# Patient Record
Sex: Male | Born: 1935 | Race: White | Hispanic: No | Marital: Married | Smoking: Never smoker
Health system: Southern US, Community
[De-identification: ages and names within clinical notes are randomized; demographics above are authoritative.]

## PROBLEM LIST (undated history)

## (undated) DIAGNOSIS — I4891 Unspecified atrial fibrillation: Secondary | ICD-10-CM

## (undated) DIAGNOSIS — I10 Essential (primary) hypertension: Secondary | ICD-10-CM

## (undated) DIAGNOSIS — E119 Type 2 diabetes mellitus without complications: Secondary | ICD-10-CM

## (undated) HISTORY — PX: BACK SURGERY: SHX140

---

## 2004-10-17 ENCOUNTER — Other Ambulatory Visit: Payer: Self-pay

## 2004-10-17 ENCOUNTER — Inpatient Hospital Stay: Payer: Self-pay | Admitting: Anesthesiology

## 2004-10-19 ENCOUNTER — Other Ambulatory Visit: Payer: Self-pay

## 2007-06-02 ENCOUNTER — Emergency Department: Payer: Self-pay | Admitting: Emergency Medicine

## 2010-11-28 HISTORY — PX: CARDIAC SURGERY: SHX584

## 2016-05-25 ENCOUNTER — Ambulatory Visit
Admission: EM | Admit: 2016-05-25 | Discharge: 2016-05-25 | Disposition: A | Discharge status: Home / Self Care | Payer: Medicare Other | Attending: Family Medicine | Admitting: Family Medicine

## 2016-05-25 DIAGNOSIS — M5431 Sciatica, right side: Secondary | ICD-10-CM | POA: Diagnosis not present

## 2016-05-25 HISTORY — DX: Type 2 diabetes mellitus without complications: E11.9

## 2016-05-25 HISTORY — DX: Unspecified atrial fibrillation: I48.91

## 2016-05-25 HISTORY — DX: Essential (primary) hypertension: I10

## 2016-05-25 MED ORDER — HYDROCODONE-ACETAMINOPHEN 5-325 MG PO TABS: ORAL_TABLET | ORAL | Status: AC

## 2016-05-25 MED ORDER — METHYLPREDNISOLONE SODIUM SUCC 125 MG IJ SOLR
125.0000 mg | Freq: Once | INTRAMUSCULAR | Status: AC
  Administered 2016-05-25: 125 mg via INTRAMUSCULAR

## 2016-05-25 NOTE — ED Provider Notes (Signed)
CSN: [id number]     Arrival date & time 05/25/16  1028 History   First MD Initiated Contact with Patient 05/25/16 1054     Chief Complaint  Patient presents with  . Back Pain   (Consider location/radiation/quality/duration/timing/severity/associated sxs/prior Treatment) HPI Comments: 82 yo male with a c/o 2 days h/o right low back pain radiating down the back of the leg. Denies any trauma, fevers, chills, numbness/tingling, saddle anesthesia, bowel or bladder problems, rash. States has had similar episodes in the past and states his PCP gives him a "steroid shot".   Patient is a 82 y.o. male presenting with back pain. The history is provided by the patient.  Back Pain   Past Medical History  Diagnosis Date  . Hypertension   . Diabetes (HCC)   . Atrial fibrillation [facility](HCC)    Past Surgical History  Procedure Laterality Date  . Back surgery  1981,1991    X 3  . Cardiac surgery  2012   History reviewed. No pertinent family history. Social History  Substance Use Topics  . Smoking status: Never Smoker   . Smokeless tobacco: None  . Alcohol Use: No    Review of Systems  Musculoskeletal: Positive for back pain.    Allergies  Review of patient's allergies indicates no known allergies.  Home Medications   Prior to Admission medications   Medication Sig Start Date End Date Taking? Authorizing Provider  cloNIDine (CATAPRES) 0.1 MG tablet Take 0.1 mg by mouth 2 (two) times daily.   Yes Historical Provider, MD  glipiZIDE (GLUCOTROL) 10 MG tablet Take 10 mg by mouth daily before breakfast.   Yes Historical Provider, MD  lisinopril (PRINIVIL,ZESTRIL) 20 MG tablet Take 20 mg by mouth daily.   Yes Historical Provider, MD  pioglitazone (ACTOS) 30 MG tablet Take 30 mg by mouth daily.   Yes Historical Provider, MD  warfarin (COUMADIN) 2 MG tablet Take 2 mg by mouth daily.   Yes Historical Provider, MD  HYDROcodone-acetaminophen (NORCO/VICODIN) 5-325 MG tablet 1-2 tabs po q 8 hours prn  05/25/16   Payton Mccallumrlando Nimra Puccinelli, MD   Meds Ordered and Administered this Visit   Medications  methylPREDNISolone sodium succinate (SOLU-MEDROL) 125 mg/2 mL injection 125 mg (125 mg Intramuscular Given 05/25/16 1143)    BP 132/54 mmHg  Pulse 73  Temp(Src) 97.8 F (36.6 C) (Tympanic)  Resp 16  Ht 6\' 2"  (1.88 m)  Wt 210 lb (95.255 kg)  BMI 26.95 kg/m2  SpO2 97% No data found.   Physical Exam  Constitutional: He appears well-developed and well-nourished. No distress.  Neck: Normal range of motion. Neck supple. No tracheal deviation present.  Pulmonary/Chest: Effort normal. No stridor. No respiratory distress.  Musculoskeletal:       Cervical back: He exhibits normal range of motion, no bony tenderness, no swelling, no edema, no deformity, no laceration, no pain and normal pulse.       Lumbar back: He exhibits tenderness (over the lumbar sacral paraspinous muscles on the right and over the right buttock ) and spasm. He exhibits normal range of motion, no bony tenderness, no swelling, no edema, no deformity, no laceration, no pain and normal pulse.  Neurological: He is alert. He has normal reflexes. He exhibits normal muscle tone. Coordination normal.  Skin: No rash noted. He is not diaphoretic.  Nursing note and vitals reviewed.   ED Course  Procedures (including critical care time)  Labs Review Labs Reviewed - No data to display  Imaging Review No results found.  Visual Acuity Review  Right Eye Distance:   Left Eye Distance:   Bilateral Distance:    Right Eye Near:   Left Eye Near:    Bilateral Near:         MDM   1. Sciatica of right side    Discharge Medication List as of 05/25/2016 11:36 AM    START taking these medications   Details  HYDROcodone-acetaminophen (NORCO/VICODIN) 5-325 MG tablet 1-2 tabs po q 8 hours prn, Print       1.  diagnosis reviewed with patient 2. rx as per orders above; reviewed possible side effects, interactions, risks and benefits   3. Patient given solumedrol 125mg  IM x1 4.Recommend supportive treatment with gentle back stretches 5. Follow-up prn if symptoms worsen or don't improve    Payton Mccallumrlando Aerika Groll, MD 05/25/16 2131

## 2016-05-25 NOTE — Discharge Instructions (Signed)
°Back Exercises °The following exercises strengthen the muscles that help to support the back. They also help to keep the lower back flexible. Doing these exercises can help to prevent back pain or lessen existing pain. °If you have back pain or discomfort, try doing these exercises 2-3 times each day or as told by your health care provider. When the pain goes away, do them once each day, but increase the number of times that you repeat the steps for each exercise (do more repetitions). If you do not have back pain or discomfort, do these exercises once each day or as told by your health care provider. °EXERCISES °Single Knee to Chest °Repeat these steps 3-5 times for each leg: °1. Lie on your back on a firm bed or the floor with your legs extended. °2. Bring one knee to your chest. Your other leg should stay extended and in contact with the floor. °3. Hold your knee in place by grabbing your knee or thigh. °4. Pull on your knee until you feel a gentle stretch in your lower back. °5. Hold the stretch for 10-30 seconds. °6. Slowly release and straighten your leg. °Pelvic Tilt °Repeat these steps 5-10 times: °1. Lie on your back on a firm bed or the floor with your legs extended. °2. Bend your knees so they are pointing toward the ceiling and your feet are flat on the floor. °3. Tighten your lower abdominal muscles to press your lower back against the floor. This motion will tilt your pelvis so your tailbone points up toward the ceiling instead of pointing to your feet or the floor. °4. With gentle tension and even breathing, hold this position for 5-10 seconds. °Cat-Cow °Repeat these steps until your lower back becomes more flexible: °1. Get into a hands-and-knees position on a firm surface. Keep your hands under your shoulders, and keep your knees under your hips. You may place padding under your knees for comfort. °2. Let your head hang down, and point your tailbone toward the floor so your lower back becomes  rounded like the back of a cat. °3. Hold this position for 5 seconds. °4. Slowly lift your head and point your tailbone up toward the ceiling so your back forms a sagging arch like the back of a cow. °5. Hold this position for 5 seconds. °Press-Ups °Repeat these steps 5-10 times: °1. Lie on your abdomen (face-down) on the floor. °2. Place your palms near your head, about shoulder-width apart. °3. While you keep your back as relaxed as possible and keep your hips on the floor, slowly straighten your arms to raise the top half of your body and lift your shoulders. Do not use your back muscles to raise your upper torso. You may adjust the placement of your hands to make yourself more comfortable. °4. Hold this position for 5 seconds while you keep your back relaxed. °5. Slowly return to lying flat on the floor. °Bridges °Repeat these steps 10 times: °1. Lie on your back on a firm surface. °2. Bend your knees so they are pointing toward the ceiling and your feet are flat on the floor. °3. Tighten your buttocks muscles and lift your buttocks off of the floor until your waist is at almost the same height as your knees. You should feel the muscles working in your buttocks and the back of your thighs. If you do not feel these muscles, slide your feet 1-2 inches farther away from your buttocks. °4. Hold this position for 3-5   seconds. 5. Slowly lower your hips to the starting position, and allow your buttocks muscles to relax completely. If this exercise is too easy, try doing it with your arms crossed over your chest. Abdominal Crunches Repeat these steps 5-10 times: 1. Lie on your back on a firm bed or the floor with your legs extended. 2. Bend your knees so they are pointing toward the ceiling and your feet are flat on the floor. 3. Cross your arms over your chest. 4. Tip your chin slightly toward your chest without bending your neck. 5. Tighten your abdominal muscles and slowly raise your trunk (torso) high  enough to lift your shoulder blades a tiny bit off of the floor. Avoid raising your torso higher than that, because it can put too much stress on your low back and it does not help to strengthen your abdominal muscles. 6. Slowly return to your starting position. Back Lifts Repeat these steps 5-10 times: 1. Lie on your abdomen (face-down) with your arms at your sides, and rest your forehead on the floor. 2. Tighten the muscles in your legs and your buttocks. 3. Slowly lift your chest off of the floor while you keep your hips pressed to the floor. Keep the back of your head in line with the curve in your back. Your eyes should be looking at the floor. 4. Hold this position for 3-5 seconds. 5. Slowly return to your starting position. SEEK MEDICAL CARE IF:  Your back pain or discomfort gets much worse when you do an exercise.  Your back pain or discomfort does not lessen within 2 hours after you exercise. If you have any of these problems, stop doing these exercises right away. Do not do them again unless your health care provider says that you can. SEEK IMMEDIATE MEDICAL CARE IF:  You develop sudden, severe back pain. If this happens, stop doing the exercises right away. Do not do them again unless your health care provider says that you can.   This information is not intended to replace advice given to you by your health care provider. Make sure you discuss any questions you have with your health care provider.   Document Released: 12/22/2004 Document Revised: 08/05/2015 Document Reviewed: 01/08/2015 Elsevier Interactive Patient Education 2016 Elsevier Inc. Radicular Pain Radicular pain in either the arm or leg is usually from a bulging or herniated disk in the spine. A piece of the herniated disk may press against the nerves as the nerves exit the spine. This causes pain which is felt at the tips of the nerves down the arm or leg. Other causes of radicular pain may  include: 7. Fractures. 8. Heart disease. 9. Cancer. 10. An abnormal and usually degenerative state of the nervous system or nerves (neuropathy). Diagnosis may require CT or MRI scanning to determine the primary cause.  Nerves that start at the neck (nerve roots) may cause radicular pain in the outer shoulder and arm. It can spread down to the thumb and fingers. The symptoms vary depending on which nerve root has been affected. In most cases radicular pain improves with conservative treatment. Neck problems may require physical therapy, a neck collar, or cervical traction. Treatment may take many weeks, and surgery may be considered if the symptoms do not improve.  Conservative treatment is also recommended for sciatica. Sciatica causes pain to radiate from the lower back or buttock area down the leg into the foot. Often there is a history of back problems. Most patients with sciatica  are better after 2 to 4 weeks of rest and other supportive care. Short term bed rest can reduce the disk pressure considerably. Sitting, however, is not a good position since this increases the pressure on the disk. You should avoid bending, lifting, and all other activities which make the problem worse. Traction can be used in severe cases. Surgery is usually reserved for patients who do not improve within the first months of treatment. Only take over-the-counter or prescription medicines for pain, discomfort, or fever as directed by your caregiver. Narcotics and muscle relaxants may help by relieving more severe pain and spasm and by providing mild sedation. Cold or massage can give significant relief. Spinal manipulation is not recommended. It can increase the degree of disc protrusion. Epidural steroid injections are often effective treatment for radicular pain. These injections deliver medicine to the spinal nerve in the space between the protective covering of the spinal cord and back bones (vertebrae). Your caregiver can  give you more information about steroid injections. These injections are most effective when given within two weeks of the onset of pain.  You should see your caregiver for follow up care as recommended. A program for neck and back injury rehabilitation with stretching and strengthening exercises is an important part of management.  SEEK IMMEDIATE MEDICAL CARE IF: 5. You develop increased pain, weakness, or numbness in your arm or leg. 6. You develop difficulty with bladder or bowel control. 7. You develop abdominal pain.   This information is not intended to replace advice given to you by your health care provider. Make sure you discuss any questions you have with your health care provider.   Document Released: 12/22/2004 Document Revised: 12/05/2014 Document Reviewed: 06/10/2015 Elsevier Interactive Patient Education [employer]2016 Elsevier Inc.

## 2016-05-25 NOTE — ED Notes (Addendum)
Patient complains of lower back pain with radiation into right leg. Patient states that pain started 2 days ago and has been worsening. Patient has no known injury to area. Patient denies trouble urinating. Patient is on chronic coumadin. States that he did not take any last night because he forgot. He is here from [place] visiting family and will not make it to his doctor in [place] on Friday for his PT/INR check.

## 2016-06-02 ENCOUNTER — Emergency Department: Payer: Medicare Other

## 2016-06-02 ENCOUNTER — Emergency Department
Admission: EM | Admit: 2016-06-02 | Discharge: 2016-06-03 | Disposition: A | Discharge status: Home / Self Care | Payer: Medicare Other | Attending: Emergency Medicine | Admitting: Emergency Medicine

## 2016-06-02 DIAGNOSIS — I10 Essential (primary) hypertension: Secondary | ICD-10-CM | POA: Insufficient documentation

## 2016-06-02 DIAGNOSIS — R079 Chest pain, unspecified: Secondary | ICD-10-CM | POA: Diagnosis present

## 2016-06-02 DIAGNOSIS — T148XXA Other injury of unspecified body region, initial encounter: Secondary | ICD-10-CM

## 2016-06-02 DIAGNOSIS — E119 Type 2 diabetes mellitus without complications: Secondary | ICD-10-CM | POA: Diagnosis not present

## 2016-06-02 DIAGNOSIS — M25511 Pain in right shoulder: Secondary | ICD-10-CM

## 2016-06-02 DIAGNOSIS — Y939 Activity, unspecified: Secondary | ICD-10-CM | POA: Insufficient documentation

## 2016-06-02 DIAGNOSIS — Y929 Unspecified place or not applicable: Secondary | ICD-10-CM | POA: Diagnosis not present

## 2016-06-02 DIAGNOSIS — W010XXA Fall on same level from slipping, tripping and stumbling without subsequent striking against object, initial encounter: Secondary | ICD-10-CM | POA: Insufficient documentation

## 2016-06-02 DIAGNOSIS — S40011A Contusion of right shoulder, initial encounter: Secondary | ICD-10-CM | POA: Diagnosis not present

## 2016-06-02 DIAGNOSIS — Y999 Unspecified external cause status: Secondary | ICD-10-CM | POA: Diagnosis not present

## 2016-06-02 DIAGNOSIS — R0602 Shortness of breath: Secondary | ICD-10-CM | POA: Insufficient documentation

## 2016-06-02 LAB — COMPREHENSIVE METABOLIC PANEL
ALK PHOS: 70 U/L (ref 38–126)
ALT: 24 U/L (ref 17–63)
AST: 24 U/L (ref 15–41)
Albumin: 3.5 g/dL (ref 3.5–5.0)
Anion gap: 9 (ref 5–15)
BUN: 41 mg/dL — ABNORMAL HIGH (ref 6–20)
CHLORIDE: 102 mmol/L (ref 101–111)
CO2: 24 mmol/L (ref 22–32)
CREATININE: 1.66 mg/dL — AB (ref 0.61–1.24)
Calcium: 8.7 mg/dL — ABNORMAL LOW (ref 8.9–10.3)
GFR calc Af Amer: 43 mL/min — ABNORMAL LOW (ref >60)
GFR calc non Af Amer: 37 mL/min — ABNORMAL LOW (ref >60)
Glucose, Bld: 145 mg/dL — ABNORMAL HIGH (ref 65–99)
Potassium: 3.7 mmol/L (ref 3.5–5.1)
SODIUM: 135 mmol/L (ref 135–145)
Total Bilirubin: 0.6 mg/dL (ref 0.3–1.2)
Total Protein: 6.4 g/dL — ABNORMAL LOW (ref 6.5–8.1)

## 2016-06-02 LAB — TROPONIN I: Troponin I: <0.03 ng/mL (ref <0.03)

## 2016-06-02 LAB — CBC WITH DIFFERENTIAL/PLATELET
BASOS ABS: 0.1 10*3/uL (ref 0–0.1)
BASOS PCT: 2 %
EOS ABS: 0.1 10*3/uL (ref 0–0.7)
Eosinophils Relative: 2 %
HEMATOCRIT: 35.1 % — AB (ref 40.0–52.0)
HEMOGLOBIN: 12.2 g/dL — AB (ref 13.0–18.0)
Lymphocytes Relative: 19 %
Lymphs Abs: 1 10*3/uL (ref 1.0–3.6)
MCH: 32.3 pg (ref 26.0–34.0)
MCHC: 34.7 g/dL (ref 32.0–36.0)
MCV: 93.1 fL (ref 80.0–100.0)
Monocytes Absolute: 0.5 10*3/uL (ref 0.2–1.0)
Monocytes Relative: 10 %
NEUTROS ABS: 3.5 10*3/uL (ref 1.4–6.5)
NEUTROS PCT: 67 %
Platelets: 196 10*3/uL (ref 150–440)
RBC: 3.77 MIL/uL — AB (ref 4.40–5.90)
RDW: 15.4 % — ABNORMAL HIGH (ref 11.5–14.5)
WBC: 5.2 10*3/uL (ref 3.8–10.6)

## 2016-06-02 LAB — PROTIME-INR
INR: 2.72
PROTHROMBIN TIME: 28.4 s — AB (ref 11.4–15.0)

## 2016-06-02 LAB — LACTIC ACID, PLASMA: LACTIC ACID, VENOUS: 0.7 mmol/L (ref 0.5–1.9)

## 2016-06-02 LAB — BRAIN NATRIURETIC PEPTIDE: B NATRIURETIC PEPTIDE 5: 226 pg/mL — AB (ref 0.0–100.0)

## 2016-06-02 MED ORDER — ONDANSETRON HCL 4 MG/2ML IJ SOLN
4.0000 mg | Freq: Once | INTRAMUSCULAR | Status: AC
  Administered 2016-06-02: 4 mg via INTRAVENOUS
  Filled 2016-06-02: qty 2

## 2016-06-02 MED ORDER — MORPHINE SULFATE (PF) 4 MG/ML IV SOLN
4.0000 mg | Freq: Once | INTRAVENOUS | Status: AC
  Administered 2016-06-02: 4 mg via INTRAVENOUS
  Filled 2016-06-02: qty 1

## 2016-06-02 MED ORDER — TRAMADOL HCL 50 MG PO TABS: 50.0000 mg | ORAL_TABLET | Freq: Four times a day (QID) | ORAL | Status: AC | PRN
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
## 2016-06-03 LAB — TROPONIN I

## 2016-06-03 MED ORDER — TRAMADOL HCL 50 MG PO TABS
50.0000 mg | ORAL_TABLET | Freq: Once | ORAL | Status: AC
Start: 2016-06-03 — End: 2016-06-03
  Administered 2016-06-03: 50 mg via ORAL
  Filled 2016-06-03: qty 1

## 2017-09-29 ENCOUNTER — Encounter: Payer: Self-pay | Admitting: Emergency Medicine

## 2017-09-29 ENCOUNTER — Emergency Department: Payer: Medicare Other

## 2017-09-29 ENCOUNTER — Ambulatory Visit (INDEPENDENT_AMBULATORY_CARE_PROVIDER_SITE_OTHER)
Admission: EM | Admit: 2017-09-29 | Discharge: 2017-09-29 | Disposition: A | Discharge status: Hospice | Payer: Medicare Other | Source: Home / Self Care | Attending: Family Medicine | Admitting: Family Medicine

## 2017-09-29 ENCOUNTER — Emergency Department
Admission: EM | Admit: 2017-09-29 | Discharge: 2017-09-29 | Disposition: A | Discharge status: Home / Self Care | Payer: Medicare Other | Attending: Emergency Medicine | Admitting: Emergency Medicine

## 2017-09-29 DIAGNOSIS — L03115 Cellulitis of right lower limb: Secondary | ICD-10-CM

## 2017-09-29 DIAGNOSIS — I4819 Other persistent atrial fibrillation: Secondary | ICD-10-CM

## 2017-09-29 DIAGNOSIS — L98491 Non-pressure chronic ulcer of skin of other sites limited to breakdown of skin: Secondary | ICD-10-CM | POA: Insufficient documentation

## 2017-09-29 DIAGNOSIS — L97511 Non-pressure chronic ulcer of other part of right foot limited to breakdown of skin: Secondary | ICD-10-CM

## 2017-09-29 DIAGNOSIS — I481 Persistent atrial fibrillation: Secondary | ICD-10-CM

## 2017-09-29 DIAGNOSIS — N179 Acute kidney failure, unspecified: Secondary | ICD-10-CM | POA: Insufficient documentation

## 2017-09-29 DIAGNOSIS — Z7901 Long term (current) use of anticoagulants: Secondary | ICD-10-CM

## 2017-09-29 DIAGNOSIS — I10 Essential (primary) hypertension: Secondary | ICD-10-CM

## 2017-09-29 DIAGNOSIS — E1151 Type 2 diabetes mellitus with diabetic peripheral angiopathy without gangrene: Secondary | ICD-10-CM | POA: Insufficient documentation

## 2017-09-29 DIAGNOSIS — E11621 Type 2 diabetes mellitus with foot ulcer: Secondary | ICD-10-CM | POA: Diagnosis not present

## 2017-09-29 DIAGNOSIS — E119 Type 2 diabetes mellitus without complications: Secondary | ICD-10-CM | POA: Insufficient documentation

## 2017-09-29 DIAGNOSIS — E118 Type 2 diabetes mellitus with unspecified complications: Secondary | ICD-10-CM

## 2017-09-29 DIAGNOSIS — E860 Dehydration: Secondary | ICD-10-CM | POA: Diagnosis not present

## 2017-09-29 DIAGNOSIS — Z79899 Other long term (current) drug therapy: Secondary | ICD-10-CM | POA: Insufficient documentation

## 2017-09-29 DIAGNOSIS — Z7984 Long term (current) use of oral hypoglycemic drugs: Secondary | ICD-10-CM

## 2017-09-29 DIAGNOSIS — L539 Erythematous condition, unspecified: Secondary | ICD-10-CM | POA: Diagnosis present

## 2017-09-29 LAB — CBC WITH DIFFERENTIAL/PLATELET
BASOS ABS: 0.1 10*3/uL (ref 0–0.1)
BASOS PCT: 1 %
Eosinophils Absolute: 0.1 10*3/uL (ref 0–0.7)
Eosinophils Relative: 1 %
HEMATOCRIT: 42.2 % (ref 40.0–52.0)
HEMOGLOBIN: 14.1 g/dL (ref 13.0–18.0)
Lymphocytes Relative: 21 %
Lymphs Abs: 1.5 10*3/uL (ref 1.0–3.6)
MCH: 31.1 pg (ref 26.0–34.0)
MCHC: 33.4 g/dL (ref 32.0–36.0)
MCV: 93.3 fL (ref 80.0–100.0)
MONOS PCT: 8 %
Monocytes Absolute: 0.6 10*3/uL (ref 0.2–1.0)
NEUTROS ABS: 4.8 10*3/uL (ref 1.4–6.5)
NEUTROS PCT: 69 %
Platelets: 112 10*3/uL — ABNORMAL LOW (ref 150–440)
RBC: 4.52 MIL/uL (ref 4.40–5.90)
RDW: 14.3 % (ref 11.5–14.5)
WBC: 7 10*3/uL (ref 3.8–10.6)

## 2017-09-29 LAB — LACTIC ACID, PLASMA: Lactic Acid, Venous: 2.2 mmol/L (ref 0.5–1.9)

## 2017-09-29 LAB — COMPREHENSIVE METABOLIC PANEL
ALK PHOS: 111 U/L (ref 38–126)
ALT: 40 U/L (ref 17–63)
ANION GAP: 8 (ref 5–15)
AST: 31 U/L (ref 15–41)
Albumin: 3.8 g/dL (ref 3.5–5.0)
BUN: 38 mg/dL — ABNORMAL HIGH (ref 6–20)
CALCIUM: 9.5 mg/dL (ref 8.9–10.3)
CO2: 27 mmol/L (ref 22–32)
Chloride: 104 mmol/L (ref 101–111)
Creatinine, Ser: 1.33 mg/dL — ABNORMAL HIGH (ref 0.61–1.24)
GFR, EST AFRICAN AMERICAN: 56 mL/min — AB (ref >60)
GFR, EST NON AFRICAN AMERICAN: 48 mL/min — AB (ref >60)
Glucose, Bld: 141 mg/dL — ABNORMAL HIGH (ref 65–99)
Potassium: 4.7 mmol/L (ref 3.5–5.1)
SODIUM: 139 mmol/L (ref 135–145)
TOTAL PROTEIN: 7.2 g/dL (ref 6.5–8.1)
Total Bilirubin: 0.8 mg/dL (ref 0.3–1.2)

## 2017-09-29 LAB — GLUCOSE, CAPILLARY: Glucose-Capillary: 163 mg/dL — ABNORMAL HIGH (ref 65–99)

## 2017-09-29 MED ORDER — CLINDAMYCIN PHOSPHATE 600 MG/50ML IV SOLN
600.0000 mg | Freq: Once | INTRAVENOUS | Status: AC
  Administered 2017-09-29: 600 mg via INTRAVENOUS
  Filled 2017-09-29 (×2): qty 50

## 2017-09-29 MED ORDER — SODIUM CHLORIDE 0.9 % IV BOLUS (SEPSIS)
1000.0000 mL | Freq: Once | INTRAVENOUS | Status: AC
  Administered 2017-09-29: 1000 mL via INTRAVENOUS

## 2017-09-29 MED ORDER — CLINDAMYCIN HCL 300 MG PO CAPS: 300.0000 mg | ORAL_CAPSULE | Freq: Three times a day (TID) | ORAL | 0 refills | Status: AC

## 2017-09-29 MED ORDER — IBUPROFEN 400 MG PO TABS
400.0000 mg | ORAL_TABLET | Freq: Once | ORAL | Status: AC
  Administered 2017-09-29: 400 mg via ORAL
  Filled 2017-09-29: qty 1

## 2017-09-29 NOTE — ED Triage Notes (Signed)
Patient complains of foot pain and redness that started about one week ago, but worsening recently.

## 2017-09-29 NOTE — Discharge Instructions (Signed)
You are evaluated for skin infection to the foot called cellulitis.  As we discussed, you do not have other signs concerning for systemic infection called abscess.  Specifically, your white blood cell count is normal, you have no fever, your heart rate is normal, and your blood pressure is normal.  Your x-ray showed no sign of underlying bone infection.  You are given IV antibiotic dose clindamycin in the emergency department today.  Start your antibiotic tomorrow morning.  Return to the emergency room immediately for any new or worsening condition including fever, new or worsening pain, redness, skin rash, or any other symptoms concerning to you.  I would prefer that a doctor take a look at the status of the infection on Monday, either urgent care or [facility].

## 2017-09-29 NOTE — ED Triage Notes (Signed)
Pt in via POV with complaints of pain to right foot, reports going to clinic today and being sent over for IV antibiotics.  Cellulitis noted to right foot and all toes with severe discoloration.  NAD noted at this time.

## 2017-09-29 NOTE — ED Notes (Signed)
Date and time results received: 09/29/17 1601 (use smartphrase ".now" to insert current time)  Test: lactic acid Critical Value: 2.2  Name of Provider Notified: brandy charge rn

## 2017-09-29 NOTE — ED Notes (Signed)

## 2017-09-29 NOTE — ED Triage Notes (Signed)
FIRST NURSE NOTE-sent from [facility] for IV abx r/t cellulitis.  From out of town. Ambulatory without difficulty.

## 2017-09-29 NOTE — ED Provider Notes (Signed)
[facility]    CSN: [id number] Arrival date & time: 09/29/17  1333     History   Chief Complaint Chief Complaint  Patient presents with  . Foot Pain    HPI Joseph Cuevas is a 83 y.o. male.   83 yo male with diabetes mellitus, peripheral vascular disease, afib presents with a c/o right foot pain, redness and swelling for one week. Also 3 days h/o skin ulcer on top of the 4th toe. Denies any fevers, chills.     Foot Pain     Past Medical History:  Diagnosis Date  . Atrial fibrillation (HCC)   . Diabetes (HCC)   . Hypertension     There are no active problems to display for this patient.   Past Surgical History:  Procedure Laterality Date  . BACK SURGERY  1981,1991   X 3  . CARDIAC SURGERY  2012       Home Medications    Prior to Admission medications   Medication Sig Start Date End Date Taking? Authorizing Provider  glipiZIDE (GLUCOTROL) 10 MG tablet Take 10 mg by mouth daily before breakfast.   Yes [provider]  lisinopril (PRINIVIL,ZESTRIL) 20 MG tablet Take 20 mg by mouth daily.   Yes [provider]  metoprolol tartrate (LOPRESSOR) 25 MG tablet Take 25 mg by mouth 2 (two) times daily.   Yes [provider]  warfarin (COUMADIN) 2 MG tablet Take 2 mg by mouth daily.   Yes [provider]  cloNIDine (CATAPRES) 0.1 MG tablet Take 0.1 mg by mouth 2 (two) times daily.    [provider]  HYDROcodone-acetaminophen (NORCO/VICODIN) 5-325 MG tablet 1-2 tabs po q 8 hours prn 05/25/16   Payton Mccallumonty, Katerra Ingman, MD  pioglitazone (ACTOS) 30 MG tablet Take 30 mg by mouth daily.    [provider]    Family History History reviewed. No pertinent family history.  Social History Social History  Substance Use Topics  . Smoking status: Never Smoker  . Smokeless tobacco: Never Used  . Alcohol use No     Allergies   Patient has no known allergies.   Review of Systems Review of Systems   Physical  Exam Triage Vital Signs ED Triage Vitals  Enc Vitals Group     BP 09/29/17 1357 (!) 154/125     Pulse Rate 09/29/17 1357 (!) 54     Resp 09/29/17 1357 18     Temp 09/29/17 1357 97.8 F (36.6 C)     Temp Source 09/29/17 1357 Oral     SpO2 09/29/17 1357 99 %     Weight 09/29/17 1354 215 lb (97.5 kg)     Height 09/29/17 1354 6\' 2"  (1.88 m)     Head Circumference --      Peak Flow --      Pain Score 09/29/17 1354 8     Pain Loc --      Pain Edu? --      Excl. in GC? --    No data found.   Updated Vital Signs BP (!) 154/125 (BP Location: Left Arm)   Pulse (!) 54   Temp 97.8 F (36.6 C) (Oral)   Resp 18   Ht 6\' 2"  (1.88 m)   Wt 215 lb (97.5 kg)   SpO2 99%   BMI 27.60 kg/m   Visual Acuity Right Eye Distance:   Left Eye Distance:   Bilateral Distance:    Right Eye Near:   Left Eye  Near:    Bilateral Near:     Physical Exam  Constitutional: He appears well-developed and well-nourished. No distress.  Skin: He is not diaphoretic.  approx 6x8cm area of erythema, edema, tenderness over dorsum of right foot; 1cm purulent ulceration over dorsum of 4th toe  Nursing note and vitals reviewed.    UC Treatments / Results  Labs (all labs ordered are listed, but only abnormal results are displayed) Labs Reviewed  GLUCOSE, CAPILLARY - Abnormal; Notable for the following:       Result Value   Glucose-Capillary 163 (*)    All other components within normal limits  CBG MONITORING, ED    EKG  EKG Interpretation None       Radiology Dg Foot Complete Right  Result Date: 09/29/2017 CLINICAL DATA:  Right fifth toe pain. EXAM: RIGHT FOOT COMPLETE - 3+ VIEW COMPARISON:  None. FINDINGS: There is no evidence of acute fracture or dislocation. There is no evidence of arthropathy. Mild posterior calcaneal spurring is noted. No lytic destruction is seen to suggest osteomyelitis. Vascular calcifications are noted. Dorsal soft tissue swelling is noted suggesting inflammation.  IMPRESSION: Dorsal soft tissue swelling is noted suggesting inflammation. No lytic destruction is seen to suggest osteomyelitis. Electronically Signed   By: Lupita Raider, M.D.   On: 09/29/2017 15:54    Procedures Procedures (including critical care time)  Medications Ordered in UC Medications - No data to display   Initial Impression / Assessment and Plan / UC Course  I have reviewed the triage vital signs and the nursing notes.  Pertinent labs & imaging results that were available during my care of the patient were reviewed by me and considered in my medical decision making (see chart for details).       Final Clinical Impressions(s) / UC Diagnoses   Final diagnoses:  Cellulitis of right foot  Skin ulcer of toe of right foot, limited to breakdown of skin (HCC)  Controlled type 2 diabetes mellitus with complication, without long-term current use of insulin (HCC)  Persistent atrial fibrillation [facility])    New Prescriptions Discharge Medication List as of 09/29/2017  2:17 PM     1. Discussed with patient and wife, recommend patient go to Emergency Department for further evaluation and management. Report called to triage RN at [facility] ED.    Controlled Substance Prescriptions [place] Controlled Substance Registry consulted? Not Applicable   Payton Mccallum, MD 09/29/17 1730

## 2017-09-29 NOTE — ED Provider Notes (Signed)
[facility] Emergency Department Provider Note ____________________________________________   I have reviewed the triage vital signs and the triage nursing note.  HISTORY  Chief Complaint Cellulitis   Historian Patient   HPI Joseph Cuevas is a 83 y[place]o. [place] family here, noticed a small amount of redness to his right foot on Tuesday, now it is Friday and there is increased redness on the top of the foot to the mid foot and a small callus that is dark to the plantar surface of the right foot.  He presented to urgent care who referred him to the emergency department for further investigation of the cellulitis.  Reports no fevers.  He does have a history of diabetes.  Pain is mild to moderate.  No chills.  No known traumatic injury.  He never had cellulitis before.   Past Medical History:  Diagnosis Date  . Atrial fibrillation (HCC)   . Diabetes (HCC)   . Hypertension     There are no active problems to display for this patient.   Past Surgical History:  Procedure Laterality Date  . BACK SURGERY  1981,1991   X 3  . CARDIAC SURGERY  2012    Prior to Admission medications   Medication Sig Start Date End Date Taking? Authorizing Provider  cloNIDine (CATAPRES) 0.1 MG tablet Take 0.1 mg by mouth 2 (two) times daily.    [provider]  glipiZIDE (GLUCOTROL) 10 MG tablet Take 10 mg by mouth daily before breakfast.    [provider]  HYDROcodone-acetaminophen (NORCO/VICODIN) 5-325 MG tablet 1-2 tabs po q 8 hours prn 05/25/16   Payton Mccallumonty, [place], MD  lisinopril (PRINIVIL,ZESTRIL) 20 MG tablet Take 20 mg by mouth daily.    [provider]  metoprolol tartrate (LOPRESSOR) 25 MG tablet Take 25 mg by mouth 2 (two) times daily.    [provider]  pioglitazone (ACTOS) 30 MG tablet Take 30 mg by mouth daily.    [provider]  warfarin (COUMADIN) 2 MG tablet Take 2 mg by mouth daily.    [provider]    No Known Allergies  No family history on file.  Social History Social History  Substance Use Topics  . Smoking status: Never Smoker  . Smokeless tobacco: Never Used  . Alcohol use No    Review of Systems  Constitutional: Negative for fever. Eyes: Negative for visual changes. ENT: Negative for sore throat. Cardiovascular: Negative for chest pain. Respiratory: Negative for shortness of breath. Gastrointestinal: Negative for abdominal pain, vomiting and diarrhea. Genitourinary: Negative for dysuria. Musculoskeletal: Negative for back pain. Skin: Right foot redness as per HPI. Neurological: Negative for headache.  ____________________________________________   PHYSICAL EXAM:  VITAL SIGNS: ED Triage Vitals  Enc Vitals Group     BP 09/29/17 1507 (!) 142/87     Pulse Rate 09/29/17 1507 (!) 58     Resp 09/29/17 1507 18     Temp 09/29/17 1507 97.7 F (36.5 C)     Temp Source 09/29/17 1507 Oral     SpO2 09/29/17 1507 97 %     Weight 09/29/17 1513 210 lb (95.3 kg)     Height 09/29/17 1513 6\' 2"  (1.88 m)     Head Circumference --      Peak Flow --      Pain Score 09/29/17 1506 8     Pain Loc --      Pain Edu? --      Excl. in  GC? --      Constitutional: Alert and oriented. Well appearing and in no distress. HEENT   Head: Normocephalic and atraumatic.      Eyes: Conjunctivae are normal. Pupils equal and round.       Ears:         Nose: No congestion/rhinnorhea.   Mouth/Throat: Mucous membranes are moist.   Neck: No stridor. Cardiovascular/Chest: Normal rate, regular rhythm.  No murmurs, rubs, or gallops. Respiratory: Normal respiratory effort without tachypnea nor retractions. Breath sounds are clear and equal bilaterally. No wheezes/rales/rhonchi. Gastrointestinal: Soft. No distention, no guarding, no rebound. Nontender.    Genitourinary/rectal:Deferred Musculoskeletal: Nontender with normal range of motion in all extremities.  Right  foot has redness on all 5 toes and the top of his foot.  There is 1+ pitting edema to bilateral feet.  He has a tiny small ulcer on the top of his third digit on the right foot which is very superficial.  He has a callus which is dark on the plantar surface.  Normal cap refill. Neurologic:  Normal speech and language. No gross or focal neurologic deficits are appreciated. Skin:  Skin is warm, dry and intact. No rash noted. Psychiatric: Mood and affect are normal. Speech and behavior are normal. Patient exhibits appropriate insight and judgment.   ____________________________________________  LABS (pertinent positives/negatives) I, Governor Rooks, MD the attending physician have reviewed the labs noted below.  Labs Reviewed  LACTIC ACID, PLASMA - Abnormal; Notable for the following:       Result Value   Lactic Acid, Venous 2.2 (*)    All other components within normal limits  COMPREHENSIVE METABOLIC PANEL - Abnormal; Notable for the following:    Glucose, Bld 141 (*)    BUN 38 (*)    Creatinine, Ser 1.33 (*)    GFR calc non Af Amer 48 (*)    GFR calc Af Amer 56 (*)    All other components within normal limits  CBC WITH DIFFERENTIAL/PLATELET - Abnormal; Notable for the following:    Platelets 112 (*)    All other components within normal limits  CULTURE, BLOOD (ROUTINE X 2)  CULTURE, BLOOD (ROUTINE X 2)  LACTIC ACID, PLASMA  URINALYSIS, COMPLETE (UACMP) WITH MICROSCOPIC    ____________________________________________    EKG I, Governor Rooks, MD, the attending physician have personally viewed and interpreted all ECGs.  None ____________________________________________  RADIOLOGY All Xrays were viewed by me.  Imaging interpreted by Radiologist, and I, Governor Rooks, MD the attending physician have reviewed the radiologist interpretation noted below.  RIGHT FOOT COMPLETE - 3+ VIEW  COMPARISON:  None.  FINDINGS: There is no evidence of acute fracture or dislocation. There  is no evidence of arthropathy. Mild posterior calcaneal spurring is noted. No lytic destruction is seen to suggest osteomyelitis. Vascular calcifications are noted. Dorsal soft tissue swelling is noted suggesting inflammation.  IMPRESSION: Dorsal soft tissue swelling is noted suggesting inflammation. No lytic destruction is seen to suggest osteomyelitis.   __________________________________________  PROCEDURES  Procedure(s) performed: None  Critical Care performed: None  ____________________________________________  No current facility-administered medications on file prior to encounter.    Current Outpatient Prescriptions on File Prior to Encounter  Medication Sig Dispense Refill  . cloNIDine (CATAPRES) 0.1 MG tablet Take 0.1 mg by mouth 2 (two) times daily.    Marland Kitchen glipiZIDE (GLUCOTROL) 10 MG tablet Take 10 mg by mouth daily before breakfast.    . HYDROcodone-acetaminophen (NORCO/VICODIN) 5-325 MG tablet 1-2 tabs po q 8  hours prn 12 tablet 0  . lisinopril (PRINIVIL,ZESTRIL) 20 MG tablet Take 20 mg by mouth daily.    . metoprolol tartrate (LOPRESSOR) 25 MG tablet Take 25 mg by mouth 2 (two) times daily.    . pioglitazone (ACTOS) 30 MG tablet Take 30 mg by mouth daily.    Marland Kitchen warfarin (COUMADIN) 2 MG tablet Take 2 mg by mouth daily.      ____________________________________________  ED COURSE / ASSESSMENT AND PLAN  Pertinent labs & imaging results that were available during my care of the patient were reviewed by me and considered in my medical decision making (see chart for details).    Patient has a cellulitis involving his toes up to the midfoot, and is without systemic symptoms or clinical concern for sepsis.  No fever, tachycardia, hypotension, or elevated white blood cell count.  Patient is having just minimal pain to the foot.  He is requesting IV medication for antibiotic coverage.  We discussed whether not to do observation in the hospital for 24 hours, but the  area of the cellulitis is relatively small, and he is not showing any signs of sepsis, and I discussed with him they could also potentially go home tonight, and I will skin mark the edges so that they can follow it closely for any worsening.  They are comfortable going home, and I think this is reasonable.  We discussed to return to the emergency department for any fever, worsening redness or swelling or pain.  Patient is with his wife and also his sister in law.  We discussed adequate hydration with mild renal failure here in the ED, patient had not had anything to eat/drink since early this morning.  DIFFERENTIAL DIAGNOSIS: Including but not limited to cellulitis, diabetic foot ulcer, osteomyelitis, sepsis, dehydration, etc.  CONSULTATIONS:   None   Patient / Family / Caregiver informed of clinical course, medical decision-making process, and agree with plan.   I discussed return precautions, follow-up instructions, and discharge instructions with patient and/or family.  Discharge Instructions : You are evaluated for skin infection to the foot called cellulitis.  As we discussed, you do not have other signs concerning for systemic infection called abscess.  Specifically, your white blood cell count is normal, you have no fever, your heart rate is normal, and your blood pressure is normal.  Your x-ray showed no sign of underlying bone infection.  You are given IV antibiotic dose clindamycin in the emergency department today.  Start your antibiotic tomorrow morning.  Return to the emergency room immediately for any new or worsening condition including fever, new or worsening pain, redness, skin rash, or any other symptoms concerning to you.  I would prefer that a doctor take a look at the status of the infection on Monday, either urgent care or [facility] clinic.  ___________________________________________   FINAL CLINICAL IMPRESSION(S) / ED DIAGNOSES   Final diagnoses:  Dehydration   Acute renal failure, unspecified acute renal failure type (HCC)  Cellulitis of right foot              Note: This dictation was prepared with Dragon dictation. Any transcriptional errors that result from this process are unintentional    Governor Rooks, MD 09/29/17 1820

## 2017-09-29 NOTE — Discharge Instructions (Signed)
Recommend patient go to Emergency Department for further evaluation and management °

## 2017-10-04 ENCOUNTER — Inpatient Hospital Stay
Admission: EM | Admit: 2017-10-04 | Discharge: 2017-10-10 | DRG: 603 | Disposition: A | Discharge status: Home / Self Care | Payer: Medicare Other | Attending: Internal Medicine | Admitting: Internal Medicine

## 2017-10-04 ENCOUNTER — Encounter: Payer: Self-pay | Admitting: *Deleted

## 2017-10-04 ENCOUNTER — Other Ambulatory Visit: Payer: Self-pay

## 2017-10-04 DIAGNOSIS — Z7984 Long term (current) use of oral hypoglycemic drugs: Secondary | ICD-10-CM

## 2017-10-04 DIAGNOSIS — I480 Paroxysmal atrial fibrillation: Secondary | ICD-10-CM | POA: Diagnosis present

## 2017-10-04 DIAGNOSIS — Z79899 Other long term (current) drug therapy: Secondary | ICD-10-CM

## 2017-10-04 DIAGNOSIS — L0390 Cellulitis, unspecified: Secondary | ICD-10-CM | POA: Diagnosis present

## 2017-10-04 DIAGNOSIS — I10 Essential (primary) hypertension: Secondary | ICD-10-CM | POA: Diagnosis present

## 2017-10-04 DIAGNOSIS — E11621 Type 2 diabetes mellitus with foot ulcer: Secondary | ICD-10-CM | POA: Diagnosis present

## 2017-10-04 DIAGNOSIS — L97519 Non-pressure chronic ulcer of other part of right foot with unspecified severity: Secondary | ICD-10-CM | POA: Diagnosis present

## 2017-10-04 DIAGNOSIS — E1151 Type 2 diabetes mellitus with diabetic peripheral angiopathy without gangrene: Secondary | ICD-10-CM | POA: Diagnosis present

## 2017-10-04 DIAGNOSIS — L03115 Cellulitis of right lower limb: Principal | ICD-10-CM | POA: Diagnosis present

## 2017-10-04 DIAGNOSIS — E785 Hyperlipidemia, unspecified: Secondary | ICD-10-CM | POA: Diagnosis present

## 2017-10-04 DIAGNOSIS — N400 Enlarged prostate without lower urinary tract symptoms: Secondary | ICD-10-CM | POA: Diagnosis present

## 2017-10-04 DIAGNOSIS — R609 Edema, unspecified: Secondary | ICD-10-CM

## 2017-10-04 DIAGNOSIS — Z7901 Long term (current) use of anticoagulants: Secondary | ICD-10-CM

## 2017-10-04 LAB — COMPREHENSIVE METABOLIC PANEL
ALK PHOS: 111 U/L (ref 38–126)
ALT: 31 U/L (ref 17–63)
AST: 32 U/L (ref 15–41)
Albumin: 3.7 g/dL (ref 3.5–5.0)
Anion gap: 9 (ref 5–15)
BILIRUBIN TOTAL: 1.3 mg/dL — AB (ref 0.3–1.2)
BUN: 34 mg/dL — AB (ref 6–20)
CALCIUM: 8.7 mg/dL — AB (ref 8.9–10.3)
CO2: 25 mmol/L (ref 22–32)
CREATININE: 1.64 mg/dL — AB (ref 0.61–1.24)
Chloride: 106 mmol/L (ref 101–111)
GFR calc Af Amer: 43 mL/min — ABNORMAL LOW (ref >60)
GFR, EST NON AFRICAN AMERICAN: 37 mL/min — AB (ref >60)
Glucose, Bld: 156 mg/dL — ABNORMAL HIGH (ref 65–99)
Potassium: 4.3 mmol/L (ref 3.5–5.1)
Sodium: 140 mmol/L (ref 135–145)
TOTAL PROTEIN: 6.7 g/dL (ref 6.5–8.1)

## 2017-10-04 LAB — CULTURE, BLOOD (ROUTINE X 2)
CULTURE: NO GROWTH
CULTURE: NO GROWTH
SPECIMEN DESCRIPTION: ADEQUATE

## 2017-10-04 LAB — CBC
HEMATOCRIT: 39 % — AB (ref 40.0–52.0)
HEMOGLOBIN: 13.1 g/dL (ref 13.0–18.0)
MCH: 31.3 pg (ref 26.0–34.0)
MCHC: 33.5 g/dL (ref 32.0–36.0)
MCV: 93.4 fL (ref 80.0–100.0)
Platelets: 145 10*3/uL — ABNORMAL LOW (ref 150–440)
RBC: 4.18 MIL/uL — AB (ref 4.40–5.90)
RDW: 14.4 % (ref 11.5–14.5)
WBC: 6.7 10*3/uL (ref 3.8–10.6)

## 2017-10-04 LAB — PROTIME-INR
INR: 2.51
Prothrombin Time: 26.9 s — ABNORMAL HIGH (ref 11.4–15.2)

## 2017-10-04 LAB — LACTIC ACID, PLASMA
LACTIC ACID, VENOUS: 1.5 mmol/L (ref 0.5–1.9)
Lactic Acid, Venous: 1.1 mmol/L (ref 0.5–1.9)

## 2017-10-04 MED ORDER — ONDANSETRON HCL 4 MG/2ML IJ SOLN: 4.0000 mg | Freq: Four times a day (QID) | INTRAMUSCULAR | Status: DC | PRN

## 2017-10-04 MED ORDER — GLIPIZIDE 5 MG PO TABS
10.0000 mg | ORAL_TABLET | Freq: Every day | ORAL | Status: DC
  Administered 2017-10-05 – 2017-10-10 (×6): 10 mg via ORAL
  Filled 2017-10-04 (×4): qty 2
  Filled 2017-10-04: qty 1
  Filled 2017-10-04: qty 2

## 2017-10-04 MED ORDER — VANCOMYCIN HCL IN DEXTROSE 1-5 GM/200ML-% IV SOLN
INTRAVENOUS | Status: AC
  Administered 2017-10-04: 1000 mg via INTRAVENOUS
  Filled 2017-10-04: qty 200

## 2017-10-04 MED ORDER — PIPERACILLIN-TAZOBACTAM 3.375 G IVPB 30 MIN
3.3750 g | Freq: Once | INTRAVENOUS | Status: AC
  Administered 2017-10-04: 3.375 g via INTRAVENOUS
  Filled 2017-10-04: qty 50

## 2017-10-04 MED ORDER — OXYCODONE HCL 5 MG PO TABS
5.0000 mg | ORAL_TABLET | Freq: Once | ORAL | Status: AC
  Administered 2017-10-04: 5 mg via ORAL

## 2017-10-04 MED ORDER — ONDANSETRON HCL 4 MG PO TABS: 4.0000 mg | ORAL_TABLET | Freq: Four times a day (QID) | ORAL | Status: DC | PRN

## 2017-10-04 MED ORDER — ACETAMINOPHEN 500 MG PO TABS
ORAL_TABLET | ORAL | Status: AC
  Administered 2017-10-04: 1000 mg via ORAL
  Filled 2017-10-04: qty 2

## 2017-10-04 MED ORDER — PIOGLITAZONE HCL 30 MG PO TABS
30.0000 mg | ORAL_TABLET | Freq: Every day | ORAL | Status: DC
  Administered 2017-10-05 – 2017-10-10 (×6): 30 mg via ORAL
  Filled 2017-10-04 (×6): qty 1

## 2017-10-04 MED ORDER — HYDROCODONE-ACETAMINOPHEN 5-325 MG PO TABS
ORAL_TABLET | ORAL | Status: AC
  Administered 2017-10-04: 1 via ORAL
  Filled 2017-10-04: qty 1

## 2017-10-04 MED ORDER — ACETAMINOPHEN 325 MG PO TABS: 650.0000 mg | ORAL_TABLET | Freq: Four times a day (QID) | ORAL | Status: DC | PRN

## 2017-10-04 MED ORDER — WARFARIN - PHYSICIAN DOSING INPATIENT: Freq: Every day | Status: DC

## 2017-10-04 MED ORDER — VANCOMYCIN HCL IN DEXTROSE 1-5 GM/200ML-% IV SOLN
1000.0000 mg | Freq: Two times a day (BID) | INTRAVENOUS | Status: DC
  Administered 2017-10-04 – 2017-10-05 (×2): 1000 mg via INTRAVENOUS
  Filled 2017-10-04 (×3): qty 200

## 2017-10-04 MED ORDER — WARFARIN SODIUM 7.5 MG PO TABS
7.5000 mg | ORAL_TABLET | Freq: Every day | ORAL | Status: DC
  Administered 2017-10-04 – 2017-10-06 (×3): 7.5 mg via ORAL
  Filled 2017-10-04 (×4): qty 1

## 2017-10-04 MED ORDER — LISINOPRIL 20 MG PO TABS
20.0000 mg | ORAL_TABLET | Freq: Every day | ORAL | Status: DC
  Administered 2017-10-05 – 2017-10-10 (×6): 20 mg via ORAL
  Filled 2017-10-04 (×6): qty 1

## 2017-10-04 MED ORDER — ACETAMINOPHEN 500 MG PO TABS
1000.0000 mg | ORAL_TABLET | Freq: Once | ORAL | Status: AC
  Administered 2017-10-04: 1000 mg via ORAL
  Filled 2017-10-04: qty 2

## 2017-10-04 MED ORDER — OXYCODONE HCL 5 MG PO TABS
ORAL_TABLET | ORAL | Status: AC
  Administered 2017-10-04: 5 mg via ORAL
  Filled 2017-10-04: qty 1

## 2017-10-04 MED ORDER — PRAVASTATIN SODIUM 20 MG PO TABS
80.0000 mg | ORAL_TABLET | Freq: Every day | ORAL | Status: DC
  Administered 2017-10-05 – 2017-10-10 (×6): 80 mg via ORAL
  Filled 2017-10-04 (×6): qty 4

## 2017-10-04 MED ORDER — PIPERACILLIN-TAZOBACTAM 3.375 G IVPB
INTRAVENOUS | Status: AC
  Administered 2017-10-04: 3.375 g via INTRAVENOUS
  Filled 2017-10-04: qty 50

## 2017-10-04 MED ORDER — SODIUM CHLORIDE 0.9 % IV BOLUS (SEPSIS)
1000.0000 mL | Freq: Once | INTRAVENOUS | Status: AC
  Administered 2017-10-04: 1000 mL via INTRAVENOUS

## 2017-10-04 MED ORDER — VANCOMYCIN HCL IN DEXTROSE 1-5 GM/200ML-% IV SOLN
1000.0000 mg | Freq: Once | INTRAVENOUS | Status: AC
  Administered 2017-10-04: 1000 mg via INTRAVENOUS
  Filled 2017-10-04: qty 200

## 2017-10-04 MED ORDER — VANCOMYCIN HCL 10 G IV SOLR
1250.0000 mg | Freq: Three times a day (TID) | INTRAVENOUS | Status: DC
  Filled 2017-10-04 (×3): qty 1250

## 2017-10-04 MED ORDER — FUROSEMIDE 20 MG PO TABS: 20.0000 mg | ORAL_TABLET | Freq: Every day | ORAL | Status: DC | PRN

## 2017-10-04 MED ORDER — TAMSULOSIN HCL 0.4 MG PO CAPS
0.4000 mg | ORAL_CAPSULE | Freq: Every day | ORAL | Status: DC
  Administered 2017-10-05 – 2017-10-10 (×6): 0.4 mg via ORAL
  Filled 2017-10-04 (×6): qty 1

## 2017-10-04 MED ORDER — VANCOMYCIN HCL IN DEXTROSE 1-5 GM/200ML-% IV SOLN
1000.0000 mg | Freq: Three times a day (TID) | INTRAVENOUS | Status: DC
  Filled 2017-10-04 (×3): qty 200

## 2017-10-04 MED ORDER — HYDROCODONE-ACETAMINOPHEN 5-325 MG PO TABS
1.0000 | ORAL_TABLET | Freq: Four times a day (QID) | ORAL | Status: DC | PRN
  Administered 2017-10-04 – 2017-10-06 (×4): 1 via ORAL
  Filled 2017-10-04 (×3): qty 1

## 2017-10-04 MED ORDER — ACETAMINOPHEN 650 MG RE SUPP: 650.0000 mg | Freq: Four times a day (QID) | RECTAL | Status: DC | PRN

## 2017-10-04 MED ORDER — METOPROLOL TARTRATE 25 MG PO TABS
25.0000 mg | ORAL_TABLET | Freq: Two times a day (BID) | ORAL | Status: DC
  Administered 2017-10-04 – 2017-10-10 (×11): 25 mg via ORAL
  Filled 2017-10-04 (×12): qty 1

## 2017-10-04 NOTE — ED Triage Notes (Signed)
Pt states he was seen in ED Friday for right foot pain and redness, states he was given IV abx in ER and then discharged with po abx, states no change in foot

## 2017-10-04 NOTE — ED Notes (Signed)
Pt transported to room 114 

## 2017-10-04 NOTE — ED Provider Notes (Signed)
[facility] Emergency Department Provider Note  ____________________________________________  Time seen: Approximately 3:17 PM  I have reviewed the triage vital signs and the nursing notes.   HISTORY  Chief Complaint Foot Pain   HPI Joseph Cuevas is a 83 y.o. male for history of type 2 diabetes who presents for evaluation of cellulitis of his right foot. Patient was seen here 5 days ago for the same. Was given a dose of IV clindamycin sent home on [place] 3 times a day which he has been taking. The redness has gotten worse, and is complaining of 8 out of 10 pain that is worse with ambulation. Today has been unable to walk due to severe pain that is sharp and throbbing and located in his right foot. Patient denies fever, chills, nausea, vomiting.  Past Medical History:  Diagnosis Date  . Atrial fibrillation (HCC)   . Diabetes (HCC)   . Hypertension     There are no active problems to display for this patient.   Past Surgical History:  Procedure Laterality Date  . BACK SURGERY  1981,1991   X 3  . CARDIAC SURGERY  2012    Prior to Admission medications   Medication Sig Start Date End Date Taking? Authorizing Provider  clindamycin (CLEOCIN) 300 MG capsule Take 1 capsule (300 mg total) by mouth 3 (three) times daily. 09/29/17   Governor Rooks, MD  cloNIDine (CATAPRES) 0.1 MG tablet Take 0.1 mg by mouth 2 (two) times daily.    [provider]  glipiZIDE (GLUCOTROL) 10 MG tablet Take 10 mg by mouth daily before breakfast.    [provider]  HYDROcodone-acetaminophen (NORCO/VICODIN) 5-325 MG tablet 1-2 tabs po q 8 hours prn 05/25/16   Payton Mccallum, MD  lisinopril (PRINIVIL,ZESTRIL) 20 MG tablet Take 20 mg by mouth daily.    [provider]  metoprolol tartrate (LOPRESSOR) 25 MG tablet Take 25 mg by mouth 2 (two) times daily.    [provider]  pioglitazone (ACTOS) 30 MG tablet Take 30 mg by mouth daily.    [provider]  warfarin (COUMADIN) 2 MG tablet Take 2 mg by mouth daily.    [provider]    Allergies Patient has no known allergies.  History reviewed. No pertinent family history.  Social History Social History   Tobacco Use  . Smoking status: Never Smoker  . Smokeless tobacco: Never Used  Substance Use Topics  . Alcohol use: No    Alcohol/week: 0.0 oz  . Drug use: No    Review of Systems  Constitutional: Negative for fever. Eyes: Negative for visual changes. ENT: Negative for sore throat. Neck: No neck pain  Cardiovascular: Negative for chest pain. Respiratory: Negative for shortness of breath. Gastrointestinal: Negative for abdominal pain, vomiting or diarrhea. Genitourinary: Negative for dysuria. Musculoskeletal: Negative for back pain. + pain, redness and swelling of the R foot Skin: Negative for rash. Neurological: Negative for headaches, weakness or numbness. Psych: No SI or HI  ____________________________________________   PHYSICAL EXAM:  VITAL SIGNS: ED Triage Vitals  Enc Vitals Group     BP 10/04/17 1241 (!) 110/58     Pulse Rate 10/04/17 1241 86     Resp 10/04/17 1241 16     Temp 10/04/17 1241 98.1 F (36.7 C)     Temp Source 10/04/17 1241 Oral     SpO2 10/04/17 1241 97 %     Weight 10/04/17 1242 210 lb (95.3 kg)  Height 10/04/17 1242 6\' 2"  (1.88 m)     Head Circumference --      Peak Flow --      Pain Score 10/04/17 1241 10     Pain Loc --      Pain Edu? --      Excl. in GC? --     Constitutional: Alert and oriented. Well appearing and in no apparent distress. HEENT:      Head: Normocephalic and atraumatic.         Eyes: Conjunctivae are normal. Sclera is non-icteric.       Mouth/Throat: Mucous membranes are moist.       Neck: Supple with no signs of meningismus. Cardiovascular: Regular rate and rhythm. No murmurs, gallops, or rubs. 2+ symmetrical distal pulses are present in all extremities. No JVD. Respiratory: Normal  respiratory effort. Lungs are clear to auscultation bilaterally. No wheezes, crackles, or rhonchi.  Gastrointestinal: Soft, non tender, and non distended with positive bowel sounds. No rebound or guarding. Musculoskeletal: There is swelling, erythema, and warmth on the dorsum of the right foot that extends the margins that were drawn 5 days ago. There are three open sores on the toes on the R foot Neurologic: Normal speech and language. Face is symmetric. Moving all extremities. No gross focal neurologic deficits are appreciated. Skin: Skin is warm, dry and intact. No rash noted. Psychiatric: Mood and affect are normal. Speech and behavior are normal.  ____________________________________________   LABS (all labs ordered are listed, but only abnormal results are displayed)  Labs Reviewed  CBC - Abnormal; Notable for the following components:      Result Value   RBC 4.18 (*)    HCT 39.0 (*)    Platelets 145 (*)    All other components within normal limits  COMPREHENSIVE METABOLIC PANEL - Abnormal; Notable for the following components:   Glucose, Bld 156 (*)    BUN 34 (*)    Creatinine, Ser 1.64 (*)    Calcium 8.7 (*)    Total Bilirubin 1.3 (*)    GFR calc non Af Amer 37 (*)    GFR calc Af Amer 43 (*)    All other components within normal limits  LACTIC ACID, PLASMA  LACTIC ACID, PLASMA   ____________________________________________  EKG  none  ____________________________________________  RADIOLOGY  none  ____________________________________________   PROCEDURES  Procedure(s) performed: None Procedures Critical Care performed:  None ____________________________________________   INITIAL IMPRESSION / ASSESSMENT AND PLAN / ED COURSE   83 y.o. male for history of type 2 diabetes who presents for evaluation of cellulitis of his right foot that has failed outpatient antibiotics. Patient is not septic but has progression of the swelling and redness on the dorsum of the  right foot also worsening pain. Afebrile with normal labs. We'll give IV Zosyn and vancomycin and admitted to the hospitalist service.      As part of my medical decision making, I reviewed the following data within the electronic MEDICAL RECORD NUMBER History obtained from family, Nursing notes reviewed and incorporated, Labs reviewed , Old chart reviewed, Discussed with admitting physician , Notes from prior ED visits and [place] Controlled Substance Database    Pertinent labs & imaging results that were available during my care of the patient were reviewed by me and considered in my medical decision making (see chart for details).    ____________________________________________   FINAL CLINICAL IMPRESSION(S) / ED DIAGNOSES  Final diagnoses:  Cellulitis of right lower extremity  NEW MEDICATIONS STARTED DURING THIS VISIT:  This SmartLink is deprecated. Use AVSMEDLIST instead to display the medication list for a patient.   Note:  This document was prepared using Dragon voice recognition software and may include unintentional dictation errors.    Nita SickleVeronese, [place], MD 10/04/17 [phone]

## 2017-10-04 NOTE — ED Notes (Signed)
ED Provider at bedside. 

## 2017-10-04 NOTE — H&P (Signed)
[employer] [facility] [place] [facility]   PATIENT NAME: Joseph Cuevas    MR#:  [id number]  DATE OF BIRTH:  04-26-1933  DATE OF ADMISSION:  10/04/2017  PRIMARY CARE PHYSICIAN: System, Pcp Not In   REQUESTING/REFERRING PHYSICIAN: Dr. Nita Sicklearolina Veronese  CHIEF COMPLAINT:   Chief Complaint  Patient presents with  . Foot Pain    HISTORY OF PRESENT ILLNESS:  Joseph Cuevas  is a 83 y.o. male with a known history of hypertension, diabetes, atrial fibrillation who presents to the hospital due to right foot pain.  Patient presented to the ER a few days ago for similar symptoms was diagnosed with cellulitis and discharged on oral clindamycin.  Despite being on clindamycin for the past 5-7 days his redness seems to be getting worse and he is having significant pain in the right foot.  He had a x-ray of his right foot done during his ER visit last time which showed no evidence of osteomyelitis but just superficial inflammation.  He presents to the ER if symptoms are not improving and hospitalist services were contacted for admission as patient has failed outpatient oral antibiotics.  PAST MEDICAL HISTORY:   Past Medical History:  Diagnosis Date  . Atrial fibrillation (HCC)   . Diabetes (HCC)   . Hypertension     PAST SURGICAL HISTORY:   Past Surgical History:  Procedure Laterality Date  . BACK SURGERY  1981,1991   X 3  . CARDIAC SURGERY  2012    SOCIAL HISTORY:   Social History   Tobacco Use  . Smoking status: Never Smoker  . Smokeless tobacco: Never Used  Substance Use Topics  . Alcohol use: No    Alcohol/week: 0.0 oz    FAMILY HISTORY:   Family History  Problem Relation Age of Onset  . Liver cancer Mother     DRUG ALLERGIES:  No Known Allergies  REVIEW OF SYSTEMS:   Review of Systems  Constitutional: Negative for fever and weight loss.  HENT: Negative for congestion, nosebleeds and tinnitus.   Eyes: Negative for blurred vision, double vision and  redness.  Respiratory: Negative for cough, hemoptysis and shortness of breath.   Cardiovascular: Negative for chest pain, orthopnea, leg swelling and PND.  Gastrointestinal: Negative for abdominal pain, diarrhea, melena, nausea and vomiting.  Genitourinary: Negative for dysuria, hematuria and urgency.  Musculoskeletal: Positive for joint pain (Right foot). Negative for falls.  Neurological: Negative for dizziness, tingling, sensory change, focal weakness, seizures, weakness and headaches.  Endo/Heme/Allergies: Negative for polydipsia. Does not bruise/bleed easily.  Psychiatric/Behavioral: Negative for depression and memory loss. The patient is not nervous/anxious.     MEDICATIONS AT HOME:   Prior to Admission medications   Medication Sig Start Date End Date Taking? Authorizing Provider  clindamycin (CLEOCIN) 300 MG capsule Take 1 capsule (300 mg total) by mouth 3 (three) times daily. 09/29/17  Yes Governor RooksLord, Rebecca, MD  glipiZIDE (GLUCOTROL) 10 MG tablet Take 10 mg by mouth daily before breakfast.   Yes [provider]  lisinopril (PRINIVIL,ZESTRIL) 20 MG tablet Take 20 mg by mouth daily.   Yes [provider]  lisinopril-hydrochlorothiazide (PRINZIDE,ZESTORETIC) 20-25 MG tablet Take 1 tablet daily by mouth. 08/30/17  Yes [provider]  metoprolol tartrate (LOPRESSOR) 25 MG tablet Take 25 mg by mouth 2 (two) times daily.   Yes [provider]  pioglitazone (ACTOS) 30 MG tablet Take 30 mg by mouth daily.   Yes [provider]  pravastatin (PRAVACHOL) 80 MG tablet  Take 80 mg daily by mouth.   Yes [provider]  tamsulosin (FLOMAX) 0.4 MG CAPS capsule Take 0.4 mg daily by mouth.   Yes [provider]  warfarin (COUMADIN) 7.5 MG tablet Take 7.5 mg daily by mouth.    Yes [provider]  furosemide (LASIX) 20 MG tablet Take 20 mg daily as needed by mouth. 09/11/17   [provider]  HYDROcodone-acetaminophen  (NORCO/VICODIN) 5-325 MG tablet 1-2 tabs po q 8 hours prn 05/25/16   Payton Mccallum, MD  oxyCODONE-acetaminophen (PERCOCET/ROXICET) 5-325 MG tablet oxycodone-acetaminophen 5 mg-325 mg tablet    [provider]      VITAL SIGNS:  Blood pressure (!) 110/58, pulse 86, temperature 98.1 F (36.7 C), temperature source Oral, resp. rate 16, height 6\' 2"  (1.88 m), weight 95.3 kg (210 lb), SpO2 97 %.  PHYSICAL EXAMINATION:  Physical Exam  GENERAL:  83 y.o.-year-old patient lying in the bed with no acute distress.  EYES: Pupils equal, round, reactive to light and accommodation. No scleral icterus. Extraocular muscles intact.  HEENT: Head atraumatic, normocephalic. Oropharynx and nasopharynx clear. No oropharyngeal erythema, moist oral mucosa  NECK:  Supple, no jugular venous distention. No thyroid enlargement, no tenderness.  LUNGS: Normal breath sounds bilaterally, no wheezing, rales, rhonchi. No use of accessory muscles of respiration.  CARDIOVASCULAR: S1, S2 RRR. No murmurs, rubs, gallops, clicks.  ABDOMEN: Soft, nontender, nondistended. Bowel sounds present. No organomegaly or mass.  EXTREMITIES: No pedal edema, cyanosis, or clubbing. + 2 pedal & radial pulses b/l.  Redness on the right foot on the dorsum with no ulcer and no warmth. NEUROLOGIC: Cranial nerves II through XII are intact. No focal Motor or sensory deficits appreciated b/l PSYCHIATRIC: The patient is alert and oriented x 3.  SKIN: No obvious rash, lesion, or ulcer.   LABORATORY PANEL:   CBC Recent Labs  Lab 10/04/17 1244  WBC 6.7  HGB 13.1  HCT 39.0*  PLT 145*   ------------------------------------------------------------------------------------------------------------------  Chemistries  Recent Labs  Lab 10/04/17 1244  NA 140  K 4.3  CL 106  CO2 25  GLUCOSE 156*  BUN 34*  CREATININE 1.64*  CALCIUM 8.7*  AST 32  ALT 31  ALKPHOS 111  BILITOT 1.3*    ------------------------------------------------------------------------------------------------------------------  Cardiac Enzymes No results for input(s): TROPONINI in the last 168 hours. ------------------------------------------------------------------------------------------------------------------  RADIOLOGY:  No results found.   IMPRESSION AND PLAN:   83 year old male with past medical history of diabetes, hypertension, atrial fibrillation who presents to the hospital due to right foot pain.  1.  Right foot cellulitis- patient has failed outpatient oral antibiotics with clindamycin.  Will admit the patient and start him on IV vancomycin.  Clinically he is afebrile and hemodynamically stable. -Follow clinically.  2. History of atrial fibrillation-rate controlled.  Continue metoprolol.  Continue Coumadin.  3.  Essential hypertension- continue metoprolol, lisinopril.  4.  Diabetes type 2 without complication- continue Glipizide, Actos.    5. BPH - cont. Flomax.   6. Hyperlipidemia - cont. Pravachol.    All the records are reviewed and case discussed with ED provider. Management plans discussed with the patient, family and they are in agreement.  CODE STATUS: Full code  TOTAL TIME TAKING CARE OF THIS PATIENT: 40 minutes.    Houston Siren M.D on 10/04/2017 at 4:31 PM  Between 7am to 6pm - Pager - [phone]  After 6pm go to [email or web address] - password [email or web address] [email or web address]  [facility]  Office  [phone]  CC: Primary  care physician; System, Pcp Not In

## 2017-10-04 NOTE — Progress Notes (Signed)
ANTIBIOTIC CONSULT NOTE - INITIAL  Pharmacy Consult for Vancomycin  Indication: cellulitis  No Known Allergies  Patient Measurements: Height: 6\' 2"  (188 cm) Weight: 210 lb (95.3 kg) IBW/kg (Calculated) : 82.2 Adjusted Body Weight:  87.4  Vital Signs: Temp: 97.4 F (36.3 C) (11/07 2050) Temp Source: Oral (11/07 2050) BP: 186/76 (11/07 2050) Pulse Rate: 84 (11/07 2050) Intake/Output from previous day: No intake/output data recorded. Intake/Output from this shift: No intake/output data recorded.  Labs: Recent Labs    10/04/17 1244  WBC 6.7  HGB 13.1  PLT 145*  CREATININE 1.64*   Estimated Creatinine Clearance: 40.4 mL/min (A) (by C-G formula based on SCr of 1.64 mg/dL (H)). No results for input(s): VANCOTROUGH, VANCOPEAK, VANCORANDOM, GENTTROUGH, GENTPEAK, GENTRANDOM, TOBRATROUGH, TOBRAPEAK, TOBRARND, AMIKACINPEAK, AMIKACINTROU, AMIKACIN in the last 72 hours.   Microbiology: Recent Results (from the past 720 hour(s))  Blood culture (routine x 2)     Status: None   Collection Time: 09/29/17  4:34 PM  Result Value Ref Range Status   Specimen Description   Final    BLOOD Blood Culture results may not be optimal due to an excessive volume of blood received in culture bottles   Special Requests   Final    BOTTLES DRAWN AEROBIC AND ANAEROBIC LEFT ANTECUBITAL   Culture NO GROWTH 5 DAYS  Final   Report Status 10/04/2017 FINAL  Final  Blood culture (routine x 2)     Status: None   Collection Time: 09/29/17  4:34 PM  Result Value Ref Range Status   Specimen Description BLOOD Blood Culture adequate volume  Final   Special Requests   Final    BOTTLES DRAWN AEROBIC AND ANAEROBIC RIGHT ANTECUBITAL   Culture NO GROWTH 5 DAYS  Final   Report Status 10/04/2017 FINAL  Final    Medical History: Past Medical History:  Diagnosis Date  . Atrial fibrillation (HCC)   . Diabetes (HCC)   . Hypertension     Medications:  Scheduled:  . [START ON 10/05/2017] glipiZIDE  10 mg Oral  QAC breakfast  . [START ON 10/05/2017] lisinopril  20 mg Oral Daily  . metoprolol tartrate  25 mg Oral BID  . [START ON 10/05/2017] pioglitazone  30 mg Oral Daily  . [START ON 10/05/2017] pravastatin  80 mg Oral Daily  . [START ON 10/05/2017] tamsulosin  0.4 mg Oral Daily  . warfarin  7.5 mg Oral Daily   Assessment: CrCl = 40.4 ml/min Ke = 0.038 hr-1 T1/2 = 18.2 hrs Vd = 66.7 L   Goal of Therapy:  Vancomycin trough level 10-15 mcg/ml  Plan  Vancomycin 1 gm IV X 1 given on 11/7 @ 1449. Vancomycin 1 gm IV Q12H ordered to start on 11/7 @ 22:00, ~ 7 hrs after 1st dose (stacked dosing).  This pt will reach [facility] on 11/9 @ 0300 . Will draw vanc trough on 11/9 0930 , which will be at [facility].   Cyndel Griffey D 10/04/2017,9:15 PM

## 2017-10-05 ENCOUNTER — Observation Stay: Payer: Medicare Other

## 2017-10-05 DIAGNOSIS — E1151 Type 2 diabetes mellitus with diabetic peripheral angiopathy without gangrene: Secondary | ICD-10-CM | POA: Diagnosis present

## 2017-10-05 DIAGNOSIS — Z7901 Long term (current) use of anticoagulants: Secondary | ICD-10-CM | POA: Diagnosis not present

## 2017-10-05 DIAGNOSIS — I480 Paroxysmal atrial fibrillation: Secondary | ICD-10-CM | POA: Diagnosis present

## 2017-10-05 DIAGNOSIS — Z79899 Other long term (current) drug therapy: Secondary | ICD-10-CM | POA: Diagnosis not present

## 2017-10-05 DIAGNOSIS — L03115 Cellulitis of right lower limb: Secondary | ICD-10-CM | POA: Diagnosis not present

## 2017-10-05 DIAGNOSIS — E785 Hyperlipidemia, unspecified: Secondary | ICD-10-CM | POA: Diagnosis present

## 2017-10-05 DIAGNOSIS — L97519 Non-pressure chronic ulcer of other part of right foot with unspecified severity: Secondary | ICD-10-CM | POA: Diagnosis present

## 2017-10-05 DIAGNOSIS — E11621 Type 2 diabetes mellitus with foot ulcer: Secondary | ICD-10-CM | POA: Diagnosis present

## 2017-10-05 DIAGNOSIS — I10 Essential (primary) hypertension: Secondary | ICD-10-CM | POA: Diagnosis present

## 2017-10-05 DIAGNOSIS — N400 Enlarged prostate without lower urinary tract symptoms: Secondary | ICD-10-CM | POA: Diagnosis present

## 2017-10-05 DIAGNOSIS — Z7984 Long term (current) use of oral hypoglycemic drugs: Secondary | ICD-10-CM | POA: Diagnosis not present

## 2017-10-05 MED ORDER — AMPICILLIN-SULBACTAM SODIUM 3 (2-1) G IJ SOLR
3.0000 g | Freq: Four times a day (QID) | INTRAMUSCULAR | Status: DC
  Administered 2017-10-05 – 2017-10-09 (×16): 3 g via INTRAVENOUS
  Filled 2017-10-05 (×19): qty 3

## 2017-10-05 MED ORDER — WARFARIN - PHARMACIST DOSING INPATIENT
Freq: Every day | Status: DC
  Administered 2017-10-05 – 2017-10-09 (×4)

## 2017-10-05 MED ORDER — VANCOMYCIN HCL 10 G IV SOLR
1250.0000 mg | INTRAVENOUS | Status: DC
  Administered 2017-10-05: 1250 mg via INTRAVENOUS
  Filled 2017-10-05 (×2): qty 1250

## 2017-10-05 MED ORDER — ZOLPIDEM TARTRATE 5 MG PO TABS
5.0000 mg | ORAL_TABLET | Freq: Every evening | ORAL | Status: DC | PRN
  Administered 2017-10-05 – 2017-10-09 (×5): 5 mg via ORAL
  Filled 2017-10-05 (×6): qty 1

## 2017-10-05 NOTE — Progress Notes (Signed)
ANTICOAGULATION CONSULT NOTE - Initial Consult  Pharmacy Consult for warfarin Indication: atrial fibrillation  No Known Allergies  Patient Measurements: Height: 6\' 2"  (188 cm) Weight: 243 lb (110.2 kg) IBW/kg (Calculated) : 82.2  Vital Signs: Temp: 97.4 F (36.3 C) (11/08 0402) Temp Source: Oral (11/08 0745) BP: 154/98 (11/08 0745) Pulse Rate: 73 (11/08 0745)  Labs: Recent Labs    10/04/17 1244 10/04/17 2117  HGB 13.1  --   HCT 39.0*  --   PLT 145*  --   LABPROT  --  26.9*  INR  --  2.51  CREATININE 1.64*  --     Estimated Creatinine Clearance: 45.9 mL/min (A) (by C-G formula based on SCr of 1.64 mg/dL (H)).  Medical History: Past Medical History:  Diagnosis Date  . Atrial fibrillation (HCC)   . Diabetes (HCC)   . Hypertension    Assessment: Pharmacy consult entered to dose and monitor warfarin in this 83 year old male who was taking warfarin PTA for atrial fibrillation. INR = 2.5 was therapeutic on admission  Home regimen: Warfarin 7.5 mg PO daily  Dosing history: Date INR Dose 11/7 2.5 7.5 mg  11/8 -  Goal of Therapy:  INR 2-3 Monitor platelets by anticoagulation protocol: Yes   Plan:  INR = 2.5 obtained last night was therapeutic. Will continue home dose of warfarin 7.5 mg PO daily. No significant DDI at this time. Will recheck INR with AM labs tomorrow.  Cindi CarbonMary M Massimiliano Rohleder, PharmD, BCPS Clinical Pharmacist 10/05/2017,8:58 AM

## 2017-10-05 NOTE — Progress Notes (Signed)
Medications administered by student RN [id number] with supervision of Clinical Instructor Jashanti Clinkscale MSN, RN-BC or patient's assigned RN.   

## 2017-10-05 NOTE — Progress Notes (Addendum)
ANTIBIOTIC CONSULT NOTE - INITIAL  Pharmacy Consult for Vancomycin  Indication: cellulitis  No Known Allergies  Patient Measurements: Height: 6\' 2"  (188 cm) Weight: 243 lb (110.2 kg) IBW/kg (Calculated) : 82.2 Adjusted Body Weight:  93 kg  Vital Signs: Temp: 97.4 F (36.3 C) (11/08 0402) Temp Source: Oral (11/08 0745) BP: 154/98 (11/08 0745) Pulse Rate: 73 (11/08 0745) Intake/Output from previous day: 11/07 0701 - 11/08 0700 In: 200 [IV Piggyback:200] Out: -  Intake/Output from this shift: Total I/O In: -  Out: 300 [Urine:300]  Labs: Recent Labs    10/04/17 1244  WBC 6.7  HGB 13.1  PLT 145*  CREATININE 1.64*   Estimated Creatinine Clearance: 45.9 mL/min (A) (by C-G formula based on SCr of 1.64 mg/dL (H)). No results for input(s): VANCOTROUGH, VANCOPEAK, VANCORANDOM, GENTTROUGH, GENTPEAK, GENTRANDOM, TOBRATROUGH, TOBRAPEAK, TOBRARND, AMIKACINPEAK, AMIKACINTROU, AMIKACIN in the last 72 hours.   Microbiology: Recent Results (from the past 720 hour(s))  Blood culture (routine x 2)     Status: None   Collection Time: 09/29/17  4:34 PM  Result Value Ref Range Status   Specimen Description   Final    BLOOD Blood Culture results may not be optimal due to an excessive volume of blood received in culture bottles   Special Requests   Final    BOTTLES DRAWN AEROBIC AND ANAEROBIC LEFT ANTECUBITAL   Culture NO GROWTH 5 DAYS  Final   Report Status 10/04/2017 FINAL  Final  Blood culture (routine x 2)     Status: None   Collection Time: 09/29/17  4:34 PM  Result Value Ref Range Status   Specimen Description BLOOD Blood Culture adequate volume  Final   Special Requests   Final    BOTTLES DRAWN AEROBIC AND ANAEROBIC RIGHT ANTECUBITAL   Culture NO GROWTH 5 DAYS  Final   Report Status 10/04/2017 FINAL  Final     Assessment: Pharmacy consulted to dose and monitor vancomycin for right foot cellulitis in this 83 year old male. Patient was seen in ED on 11/2 and discharged on  clindamycin but reports back with worsening cellulitis.   Kinetics: Using adjusted body weight = 93 kg, CrCl 46 mL/min  Ke: 0.043 Half-life: 16 hrs Vd: 65 L Cmin (estimated) = ~12 mcg/mL  Goal of Therapy:  Vancomycin trough level 10-15 mcg/ml  Plan Patient currently has orders for vancomycin 1000 mg IV q12h and has received 2 doses on that regimen. Due to renal function and calculated half-life of 16 hours, will change vancomycin dose to 1250 mg IV q24h. Will recheck SCr tomorrow morning.  Cindi CarbonMary M Lindzee Gouge, PharmD, BCPS Clinical Pharmacist 10/05/2017,9:06 AM   Addendum:  Vancomycin 1000 mg dose started at 0830 this morning. Patient received partial dose but has infiltration so dose had to be stopped.   Since patient received two 1000 mg stacked doses yesterday, will retime q24h vancomycin to start at 2200 this evening (24 hours from last full dose).  Cindi CarbonMary M Ronnesha Mester, PharmD 10/05/17 9:26 AM   Addendum: Consult entered for pharmacy to dose ampicillin/sulbactam. Ordered ampicillin/sulbactam 3 g IV q6h.  Cindi CarbonMary M Marai Teehan, PharmD 10/05/17 11:11 AM

## 2017-10-05 NOTE — Plan of Care (Signed)
Pt A/O. Safely ambulates in room independently. Patient updated on current plan of care: IV abx and pain control.

## 2017-10-05 NOTE — Progress Notes (Signed)
[employer] - [place] [facility]                                                                                                                                                                                  Patient Demographics   Joseph Cuevas, is a 83 y.o. male, DOB - 1933/11/06, RUE:[id number]  Admit date - 10/04/2017   Admitting Physician Houston SirenVivek J Sainani, MD  Outpatient Primary MD for the patient is System, Pcp Not In   LOS - 0  Subjective: Patient swelling still persists now he has some redness left foot as well    Review of Systems:   CONSTITUTIONAL: No documented fever. No fatigue, weakness. No weight gain, no weight loss.  EYES: No blurry or double vision.  ENT: No tinnitus. No postnasal drip. No redness of the oropharynx.  RESPIRATORY: No cough, no wheeze, no hemoptysis. No dyspnea.  CARDIOVASCULAR: No chest pain. No orthopnea. No palpitations. No syncope.  GASTROINTESTINAL: No nausea, no vomiting or diarrhea. No abdominal pain. No melena or hematochezia.  GENITOURINARY: No dysuria or hematuria.  ENDOCRINE: No polyuria or nocturia. No heat or cold intolerance.  HEMATOLOGY: No anemia. No bruising. No bleeding.  INTEGUMENTARY: Right foot erythema and redness MUSCULOSKELETAL: No arthritis. No swelling. No gout.  NEUROLOGIC: No numbness, tingling, or ataxia. No seizure-type activity.  PSYCHIATRIC: No anxiety. No insomnia. No ADD.    Vitals:   Vitals:   10/04/17 1856 10/04/17 2050 10/05/17 0402 10/05/17 0745  BP: (!) 180/79 (!) 186/76 134/76 (!) 154/98  Pulse: 83 84 72 73  Resp: 18 20 14 14   Temp:  (!) 97.4 F (36.3 C) (!) 97.4 F (36.3 C)   TempSrc:  Oral Oral Oral  SpO2: 99% 100% 98% 99%  Weight:  243 lb (110.2 kg)    Height:  6\' 2"  (1.88 m)      Wt Readings from Last 3 Encounters:  10/04/17 243 lb (110.2 kg)  09/29/17 210 lb (95.3 kg)  09/29/17 215 lb (97.5 kg)     Intake/Output Summary (Last 24 hours) at 10/05/2017 1345 Last data  filed at 10/05/2017 1200 Gross per 24 hour  Intake 200 ml  Output 600 ml  Net -400 ml    Physical Exam:   GENERAL: Pleasant-appearing in no apparent distress.  HEAD, EYES, EARS, NOSE AND THROAT: Atraumatic, normocephalic. Extraocular muscles are intact. Pupils equal and reactive to light. Sclerae anicteric. No conjunctival injection. No oro-pharyngeal erythema.  NECK: Supple. There is no jugular venous distention. No bruits, no lymphadenopathy, no thyromegaly.  HEART: Regular rate and rhythm,. No murmurs, no rubs, no clicks.  LUNGS: Clear to auscultation bilaterally. No rales or rhonchi. No  wheezes.  ABDOMEN: Soft, flat, nontender, nondistended. Has good bowel sounds. No hepatosplenomegaly appreciated.  EXTREMITIES: Bilateral lower extremity swelling left greater than right, right foot there is erythema left foot there is an area of erythema NEUROLOGIC: The patient is alert, awake, and oriented x3 with no focal motor or sensory deficits appreciated bilaterally.  SKIN: Moist and warm with no rashes appreciated.  Psych: Not anxious, depressed LN: No inguinal LN enlargement    Antibiotics   Anti-infectives (From admission, onward)   Start     Dose/Rate Route Frequency Ordered Stop   10/05/17 2200  vancomycin (VANCOCIN) 1,250 mg in sodium chloride 0.9 % 250 mL IVPB     1,250 mg 166.7 mL/hr over 90 Minutes Intravenous Every 24 hours 10/05/17 0916     10/05/17 1230  Ampicillin-Sulbactam (UNASYN) 3 g in sodium chloride 0.9 % 100 mL IVPB     3 g 200 mL/hr over 30 Minutes Intravenous Every 6 hours 10/05/17 1110     10/04/17 2200  vancomycin (VANCOCIN) 1,250 mg in sodium chloride 0.9 % 250 mL IVPB  Status:  Discontinued     1,250 mg 166.7 mL/hr over 90 Minutes Intravenous Every 8 hours 10/04/17 2109 10/04/17 2114   10/04/17 2200  vancomycin (VANCOCIN) IVPB 1000 mg/200 mL premix  Status:  Discontinued     1,000 mg 200 mL/hr over 60 Minutes Intravenous Every 8 hours 10/04/17 2114 10/04/17  2123   10/04/17 2200  vancomycin (VANCOCIN) IVPB 1000 mg/200 mL premix  Status:  Discontinued     1,000 mg 200 mL/hr over 60 Minutes Intravenous Every 12 hours 10/04/17 2123 10/05/17 0924   10/04/17 1430  piperacillin-tazobactam (ZOSYN) IVPB 3.375 g     3.375 g 100 mL/hr over 30 Minutes Intravenous  Once 10/04/17 1426 10/04/17 2312   10/04/17 1430  vancomycin (VANCOCIN) IVPB 1000 mg/200 mL premix     1,000 mg 200 mL/hr over 60 Minutes Intravenous  Once 10/04/17 1426 10/04/17 2313      Medications   Scheduled Meds: . glipiZIDE  10 mg Oral QAC breakfast  . lisinopril  20 mg Oral Daily  . metoprolol tartrate  25 mg Oral BID  . pioglitazone  30 mg Oral Daily  . pravastatin  80 mg Oral Daily  . tamsulosin  0.4 mg Oral Daily  . warfarin  7.5 mg Oral Daily  . Warfarin - Pharmacist Dosing Inpatient   Does not apply q1800   Continuous Infusions: . ampicillin-sulbactam (UNASYN) IV 3 g (10/05/17 1243)  . vancomycin     PRN Meds:.acetaminophen **OR** acetaminophen, furosemide, HYDROcodone-acetaminophen, ondansetron **OR** ondansetron (ZOFRAN) IV   Data Review:   Micro Results Recent Results (from the past 240 hour(s))  Blood culture (routine x 2)     Status: None   Collection Time: 09/29/17  4:34 PM  Result Value Ref Range Status   Specimen Description   Final    BLOOD Blood Culture results may not be optimal due to an excessive volume of blood received in culture bottles   Special Requests   Final    BOTTLES DRAWN AEROBIC AND ANAEROBIC LEFT ANTECUBITAL   Culture NO GROWTH 5 DAYS  Final   Report Status 10/04/2017 FINAL  Final  Blood culture (routine x 2)     Status: None   Collection Time: 09/29/17  4:34 PM  Result Value Ref Range Status   Specimen Description BLOOD Blood Culture adequate volume  Final   Special Requests   Final    BOTTLES DRAWN  AEROBIC AND ANAEROBIC RIGHT ANTECUBITAL   Culture NO GROWTH 5 DAYS  Final   Report Status 10/04/2017 FINAL  Final    Radiology  Reports US Venous Img Lower Bilateral  Result Date: 10/05/2017 CLINICAL DATA:  83 year old male with bilateral lower extremity swelling EXAM: BILATERAL LOWER EXTREMITY VENOUS DOPPLER ULTRASOUND TECHNIQUE: Gray-scale sonography with graded compression, as well as color Doppler and duplex ultrasound were performed to evaluate the lower extremity deep venous systems from the level of the common femoral vein and including the common femoral, femoral, profunda femoral, popliteal and calf veins including the posterior tibial, peroneal and gastrocnemius veins when visible. The superficial great saphenous vein was also interrogated. Spectral Doppler was utilized to evaluate flow at rest and with distal augmentation maneuvers in the common femoral, femoral and popliteal veins. COMPARISON:  None. FINDINGS: RIGHT LOWER EXTREMITY Common Femoral Vein: No evidence of thrombus. Normal compressibility, respiratory phasicity and response to augmentation. Increased pulsatility of the venous waveform. Saphenofemoral Junction: No evidence of thrombus. Normal compressibility and flow on color Doppler imaging. Profunda Femoral Vein: No evidence of thrombus. Normal compressibility and flow on color Doppler imaging. Femoral Vein: No evidence of thrombus. Normal compressibility, respiratory phasicity and response to augmentation. Popliteal Vein: No evidence of thrombus. Normal compressibility, respiratory phasicity and response to augmentation. Calf Veins: No evidence of thrombus. Normal compressibility and flow on color Doppler imaging. Superficial Great Saphenous Vein: No evidence of thrombus. Normal compressibility. Venous Reflux:  None. Other Findings:  None. LEFT LOWER EXTREMITY Common Femoral Vein: No evidence of thrombus. Normal compressibility, respiratory phasicity and response to augmentation. Increased pulsatility of the venous waveform. Saphenofemoral Junction: No evidence of thrombus. Normal compressibility and flow on color  Doppler imaging. Profunda Femoral Vein: No evidence of thrombus. Normal compressibility and flow on color Doppler imaging. Femoral Vein: No evidence of thrombus. Normal compressibility, respiratory phasicity and response to augmentation. Popliteal Vein: No evidence of thrombus. Normal compressibility, respiratory phasicity and response to augmentation. Calf Veins: No evidence of thrombus. Normal compressibility and flow on color Doppler imaging. Superficial Great Saphenous Vein: No evidence of thrombus. Normal compressibility. Venous Reflux:  None. Other Findings:  None. IMPRESSION: 1. No evidence of deep venous thrombosis in either lower extremity. 2. Increased pulsatility of the venous waveforms bilaterally as can be seen in the setting of elevated right heart pressures. Common considerations include tricuspid regurgitation, right heart failure, pulmonary hypertension and COPD. Electronically Signed   By: Malachy Moan M.D.   On: 10/05/2017 13:41   Dg Foot Complete Right  Result Date: 09/29/2017 CLINICAL DATA:  Right fifth toe pain. EXAM: RIGHT FOOT COMPLETE - 3+ VIEW COMPARISON:  None. FINDINGS: There is no evidence of acute fracture or dislocation. There is no evidence of arthropathy. Mild posterior calcaneal spurring is noted. No lytic destruction is seen to suggest osteomyelitis. Vascular calcifications are noted. Dorsal soft tissue swelling is noted suggesting inflammation. IMPRESSION: Dorsal soft tissue swelling is noted suggesting inflammation. No lytic destruction is seen to suggest osteomyelitis. Electronically Signed   By: Lupita Raider, M.D.   On: 09/29/2017 15:54     CBC Recent Labs  Lab 09/29/17 1513 10/04/17 1244  WBC 7.0 6.7  HGB 14.1 13.1  HCT 42.2 39.0*  PLT 112* 145*  MCV 93.3 93.4  MCH 31.1 31.3  MCHC 33.4 33.5  RDW 14.3 14.4  LYMPHSABS 1.5  --   MONOABS 0.6  --   EOSABS 0.1  --   BASOSABS 0.1  --  Chemistries  Recent Labs  Lab 09/29/17 1513 10/04/17 1244   NA 139 140  K 4.7 4.3  CL 104 106  CO2 27 25  GLUCOSE 141* 156*  BUN 38* 34*  CREATININE 1.33* 1.64*  CALCIUM 9.5 8.7*  AST 31 32  ALT 40 31  ALKPHOS 111 111  BILITOT 0.8 1.3*   ------------------------------------------------------------------------------------------------------------------ estimated creatinine clearance is 45.9 mL/min (A) (by C-G formula based on SCr of 1.64 mg/dL (H)). ------------------------------------------------------------------------------------------------------------------ No results for input(s): HGBA1C in the last 72 hours. ------------------------------------------------------------------------------------------------------------------ No results for input(s): CHOL, HDL, LDLCALC, TRIG, CHOLHDL, LDLDIRECT in the last 72 hours. ------------------------------------------------------------------------------------------------------------------ No results for input(s): TSH, T4TOTAL, T3FREE, THYROIDAB in the last 72 hours.  Invalid input(s): FREET3 ------------------------------------------------------------------------------------------------------------------ No results for input(s): VITAMINB12, FOLATE, FERRITIN, TIBC, IRON, RETICCTPCT in the last 72 hours.  Coagulation profile Recent Labs  Lab 10/04/17 2117  INR 2.51    No results for input(s): DDIMER in the last 72 hours.  Cardiac Enzymes No results for input(s): CKMB, TROPONINI, MYOGLOBIN in the last 168 hours.  Invalid input(s): CK ------------------------------------------------------------------------------------------------------------------ Invalid input(s): POCBNP    Assessment & Plan   83 year old male with past medical history of diabetes, hypertension, atrial fibrillation who presents to the hospital due to right foot pain.  1.  Right foot cellulitis- still no significant improvement I will add Unasyn to current regimen continue vancomycin He also has asymmetrical swelling of  the lower extremity of unclear etiology  2. History of atrial fibrillation-rate controlled.  Continue metoprolol.  Continue Coumadin.  3.  Essential hypertension- continue metoprolol, lisinopril.  4.  Diabetes type 2 without complication- continue Glipizide, Actos.    5. BPH - cont. Flomax.   6. Hyperlipidemia - cont. Pravachol.        Code Status Orders  (From admission, onward)        Start     Ordered   10/04/17 2051  Full code  Continuous     10/04/17 2050    Code Status History    Date Active Date Inactive Code Status Order ID Comments User Context   This patient has a current code status but no historical code status.           Consults  none   DVT Prophylaxis  Lovenox    Lab Results  Component Value Date   PLT 145 (L) 10/04/2017     Time Spent in minutes   35min Greater than 50% of time spent in care coordination and counseling patient regarding the condition and plan of care.   Auburn BilberryPATEL, Tyrhonda Georgiades M.D on 10/05/2017 at 1:45 PM  Between 7am to 6pm - Pager - [phone]  After 6pm go to [email or web address] - password [email or web address] [facility]  [facility]   Office  [phone]

## 2017-10-05 NOTE — Care Management Obs Status (Signed)
MEDICARE OBSERVATION STATUS NOTIFICATION   Patient Details  Name: Joseph Cuevas MRN: [id number] Date of Birth: 21-Apr-1933   Medicare Observation Status Notification Given:  Yes    Gwenette GreetBrenda S Johna Kearl, RN 10/05/2017, 1:48 PM

## 2017-10-06 ENCOUNTER — Inpatient Hospital Stay
Admit: 2017-10-06 | Discharge: 2017-10-06 | Disposition: A | Discharge status: Home / Self Care | Payer: Medicare Other | Attending: Internal Medicine | Admitting: Internal Medicine

## 2017-10-06 LAB — CBC
HCT: 37.6 % — ABNORMAL LOW (ref 40.0–52.0)
HEMOGLOBIN: 12.5 g/dL — AB (ref 13.0–18.0)
MCH: 30.9 pg (ref 26.0–34.0)
MCHC: 33.3 g/dL (ref 32.0–36.0)
MCV: 93 fL (ref 80.0–100.0)
PLATELETS: 97 10*3/uL — AB (ref 150–440)
RBC: 4.04 MIL/uL — AB (ref 4.40–5.90)
RDW: 14.3 % (ref 11.5–14.5)
WBC: 5.2 10*3/uL (ref 3.8–10.6)

## 2017-10-06 LAB — CREATININE, SERUM
Creatinine, Ser: 1.14 mg/dL (ref 0.61–1.24)
GFR calc non Af Amer: 58 mL/min — ABNORMAL LOW (ref >60)

## 2017-10-06 LAB — PROTIME-INR
INR: 2.57
Prothrombin Time: 27.4 seconds — ABNORMAL HIGH (ref 11.4–15.2)

## 2017-10-06 MED ORDER — HYDROCODONE-ACETAMINOPHEN 5-325 MG PO TABS
1.0000 | ORAL_TABLET | ORAL | Status: DC | PRN
  Administered 2017-10-06 – 2017-10-09 (×8): 2 via ORAL
  Filled 2017-10-06 (×2): qty 2
  Filled 2017-10-06: qty 1
  Filled 2017-10-06 (×5): qty 2
  Filled 2017-10-06 (×2): qty 1
  Filled 2017-10-06: qty 2

## 2017-10-06 MED ORDER — HYDROCODONE-ACETAMINOPHEN 5-325 MG PO TABS
1.0000 | ORAL_TABLET | Freq: Once | ORAL | Status: AC
  Administered 2017-10-06: 14:00:00 1 via ORAL

## 2017-10-06 MED ORDER — VANCOMYCIN HCL IN DEXTROSE 1-5 GM/200ML-% IV SOLN
1000.0000 mg | Freq: Two times a day (BID) | INTRAVENOUS | Status: DC
  Administered 2017-10-06 – 2017-10-07 (×4): 1000 mg via INTRAVENOUS
  Filled 2017-10-06 (×5): qty 200

## 2017-10-06 NOTE — Progress Notes (Addendum)
ANTIBIOTIC CONSULT NOTE - INITIAL  Pharmacy Consult for vancomycin and ampicillin/sulbactam Indication: cellulitis  No Known Allergies  Patient Measurements: Height: 6\' 2"  (188 cm) Weight: 243 lb (110.2 kg) IBW/kg (Calculated) : 82.2 Adjusted Body Weight:  93 kg  Vital Signs: Temp: 97.5 F (36.4 C) (11/09 0846) Temp Source: Oral (11/09 0846) BP: 151/61 (11/09 0846) Pulse Rate: 77 (11/09 0846) Intake/Output from previous day: 11/08 0701 - 11/09 0700 In: 1140 [P.O.:240; IV Piggyback:900] Out: 1250 [Urine:1250] Intake/Output from this shift: Total I/O In: -  Out: 325 [Urine:325]  Labs: Recent Labs    10/04/17 1244 10/06/17 0457  WBC 6.7 5.2  HGB 13.1 12.5*  PLT 145* 97*  CREATININE 1.64* 1.14   Estimated Creatinine Clearance: 66 mL/min (by C-G formula based on SCr of 1.14 mg/dL). No results for input(s): VANCOTROUGH, VANCOPEAK, VANCORANDOM, GENTTROUGH, GENTPEAK, GENTRANDOM, TOBRATROUGH, TOBRAPEAK, TOBRARND, AMIKACINPEAK, AMIKACINTROU, AMIKACIN in the last 72 hours.   Microbiology: Recent Results (from the past 720 hour(s))  Blood culture (routine x 2)     Status: None   Collection Time: 09/29/17  4:34 PM  Result Value Ref Range Status   Specimen Description   Final    BLOOD Blood Culture results may not be optimal due to an excessive volume of blood received in culture bottles   Special Requests   Final    BOTTLES DRAWN AEROBIC AND ANAEROBIC LEFT ANTECUBITAL   Culture NO GROWTH 5 DAYS  Final   Report Status 10/04/2017 FINAL  Final  Blood culture (routine x 2)     Status: None   Collection Time: 09/29/17  4:34 PM  Result Value Ref Range Status   Specimen Description BLOOD Blood Culture adequate volume  Final   Special Requests   Final    BOTTLES DRAWN AEROBIC AND ANAEROBIC RIGHT ANTECUBITAL   Culture NO GROWTH 5 DAYS  Final   Report Status 10/04/2017 FINAL  Final     Assessment: Pharmacy consulted to dose and monitor vancomycin for right foot cellulitis in  this 83 year old male. Patient was seen in ED on 11/2 and discharged on clindamycin but reports back with worsening cellulitis.   Kinetics: Using adjusted body weight = 93 kg, CrCl 66 mL/min  Ke: 0.061 Half-life: 11.4 hrs Vd: 65 L Cmin (estimated) = ~12 mcg/mL  Goal of Therapy:  Vancomycin trough level 10-15 mcg/ml  Plan Given improvement in renal function, will have to adjust vancomycin dose.  Will change to vancomycin 1000 mg IV q12h  Will check VT prior to 3rd or 4th dose which will be before steady state is reached, but want to ensure not supratherapeutic given changes in dosing.  Continue ampicillin/sulbactam 3 g IV q6h  Cindi CarbonMary M Cecilee Rosner, PharmD, BCPS Clinical Pharmacist 10/06/17 9:11 AM

## 2017-10-06 NOTE — Progress Notes (Signed)
ANTICOAGULATION CONSULT NOTE - Initial Consult  Pharmacy Consult for warfarin Indication: atrial fibrillation  No Known Allergies  Patient Measurements: Height: 6\' 2"  (188 cm) Weight: 243 lb (110.2 kg) IBW/kg (Calculated) : 82.2  Vital Signs: Temp: 97.5 F (36.4 C) (11/09 0846) Temp Source: Oral (11/09 0846) BP: 151/61 (11/09 0846) Pulse Rate: 77 (11/09 0846)  Labs: Recent Labs    10/04/17 1244 10/04/17 2117 10/06/17 0457  HGB 13.1  --  12.5*  HCT 39.0*  --  37.6*  PLT 145*  --  97*  LABPROT  --  26.9* 27.4*  INR  --  2.51 2.57  CREATININE 1.64*  --  1.14    Estimated Creatinine Clearance: 66 mL/min (by C-G formula based on SCr of 1.14 mg/dL).  Medical History: Past Medical History:  Diagnosis Date  . Atrial fibrillation (HCC)   . Diabetes (HCC)   . Hypertension    Assessment: Pharmacy consult entered to dose and monitor warfarin in this 83 year old male who was taking warfarin PTA for atrial fibrillation. INR = 2.5 was therapeutic on admission  Home regimen: Warfarin 7.5 mg PO daily  Dosing history: Date INR Dose 11/7 2.5 7.5 mg  11/8 - 7.5 mg 11/9 2.5  Goal of Therapy:  INR 2-3 Monitor platelets by anticoagulation protocol: Yes   Plan:  INR = 2.5 remains therapeutic. Will continue home dose of warfarin 7.5 mg PO daily. No significant DDI at this time. Will recheck INR with AM labs tomorrow.  Cindi CarbonMary M Betzaira Mentel, PharmD, BCPS Clinical Pharmacist 10/06/2017,12:31 PM

## 2017-10-06 NOTE — Progress Notes (Signed)
*  PRELIMINARY RESULTS* Echocardiogram 2D Echocardiogram has been performed.  Cristela BlueHege, Breon Diss 10/06/2017, 2:45 PM

## 2017-10-06 NOTE — Progress Notes (Signed)
[employer] - [place] at [facility]                                                                                                                                                                                  Patient Demographics   Joseph Cuevas, is a 83 y.o. male, DOB - 1933-03-19, HYQ:[id number]  Admit date - 10/04/2017   Admitting Physician Houston Siren, MD  Outpatient Primary MD for the patient is System, Pcp Not In   LOS - 1  Subjective: Patient swelling and redness improved He complains of chronic shortness of breath   Review of Systems:   CONSTITUTIONAL: No documented fever. No fatigue, weakness. No weight gain, no weight loss.  EYES: No blurry or double vision.  ENT: No tinnitus. No postnasal drip. No redness of the oropharynx.  RESPIRATORY: No cough, no wheeze, no hemoptysis.  Positive dyspnea.  CARDIOVASCULAR: No chest pain. No orthopnea. No palpitations. No syncope.  GASTROINTESTINAL: No nausea, no vomiting or diarrhea. No abdominal pain. No melena or hematochezia.  GENITOURINARY: No dysuria or hematuria.  ENDOCRINE: No polyuria or nocturia. No heat or cold intolerance.  HEMATOLOGY: No anemia. No bruising. No bleeding.  INTEGUMENTARY: Right foot erythema and redness MUSCULOSKELETAL: No arthritis. No swelling. No gout.  NEUROLOGIC: No numbness, tingling, or ataxia. No seizure-type activity.  PSYCHIATRIC: No anxiety. No insomnia. No ADD.    Vitals:   Vitals:   10/05/17 1959 10/05/17 2122 10/06/17 0426 10/06/17 0846  BP: 129/67 (!) 134/99 (!) 141/77 (!) 151/61  Pulse: 68 (!) 59 (!) 57 77  Resp:   14 14  Temp: (!) 97.3 F (36.3 C)  (!) 97.5 F (36.4 C) (!) 97.5 F (36.4 C)  TempSrc: Oral  Oral Oral  SpO2: 99%  98% 95%  Weight:      Height:        Wt Readings from Last 3 Encounters:  10/04/17 243 lb (110.2 kg)  09/29/17 210 lb (95.3 kg)  09/29/17 215 lb (97.5 kg)     Intake/Output Summary (Last 24 hours) at 10/06/2017 1316 Last  data filed at 10/06/2017 1013 Gross per 24 hour  Intake 1620 ml  Output 1100 ml  Net 520 ml    Physical Exam:   GENERAL: Pleasant-appearing in no apparent distress.  HEAD, EYES, EARS, NOSE AND THROAT: Atraumatic, normocephalic. Extraocular muscles are intact. Pupils equal and reactive to light. Sclerae anicteric. No conjunctival injection. No oro-pharyngeal erythema.  NECK: Supple. There is no jugular venous distention. No bruits, no lymphadenopathy, no thyromegaly.  HEART: Regular rate and rhythm,. No murmurs, no rubs, no clicks.  LUNGS: Clear to auscultation bilaterally. No rales or rhonchi. No wheezes.  ABDOMEN: Soft, flat, nontender, nondistended. Has good bowel sounds. No hepatosplenomegaly appreciated.  EXTREMITIES: Bilateral lower extremity swelling left greater than right, right foot there is erythema left foot there is an area of erythema-erythema improved NEUROLOGIC: The patient is alert, awake, and oriented x3 with no focal motor or sensory deficits appreciated bilaterally.  SKIN: Moist and warm with no rashes appreciated.  Psych: Not anxious, depressed LN: No inguinal LN enlargement    Antibiotics   Anti-infectives (From admission, onward)   Start     Dose/Rate Route Frequency Ordered Stop   10/06/17 1000  vancomycin (VANCOCIN) IVPB 1000 mg/200 mL premix     1,000 mg 200 mL/hr over 60 Minutes Intravenous Every 12 hours 10/06/17 0907     10/05/17 2200  vancomycin (VANCOCIN) 1,250 mg in sodium chloride 0.9 % 250 mL IVPB  Status:  Discontinued     1,250 mg 166.7 mL/hr over 90 Minutes Intravenous Every 24 hours 10/05/17 0916 10/06/17 0906   10/05/17 1230  Ampicillin-Sulbactam (UNASYN) 3 g in sodium chloride 0.9 % 100 mL IVPB     3 g 200 mL/hr over 30 Minutes Intravenous Every 6 hours 10/05/17 1110     10/04/17 2200  vancomycin (VANCOCIN) 1,250 mg in sodium chloride 0.9 % 250 mL IVPB  Status:  Discontinued     1,250 mg 166.7 mL/hr over 90 Minutes Intravenous Every 8 hours  10/04/17 2109 10/04/17 2114   10/04/17 2200  vancomycin (VANCOCIN) IVPB 1000 mg/200 mL premix  Status:  Discontinued     1,000 mg 200 mL/hr over 60 Minutes Intravenous Every 8 hours 10/04/17 2114 10/04/17 2123   10/04/17 2200  vancomycin (VANCOCIN) IVPB 1000 mg/200 mL premix  Status:  Discontinued     1,000 mg 200 mL/hr over 60 Minutes Intravenous Every 12 hours 10/04/17 2123 10/05/17 0924   10/04/17 1430  piperacillin-tazobactam (ZOSYN) IVPB 3.375 g     3.375 g 100 mL/hr over 30 Minutes Intravenous  Once 10/04/17 1426 10/04/17 2312   10/04/17 1430  vancomycin (VANCOCIN) IVPB 1000 mg/200 mL premix     1,000 mg 200 mL/hr over 60 Minutes Intravenous  Once 10/04/17 1426 10/04/17 2313      Medications   Scheduled Meds: . glipiZIDE  10 mg Oral QAC breakfast  . lisinopril  20 mg Oral Daily  . metoprolol tartrate  25 mg Oral BID  . pioglitazone  30 mg Oral Daily  . pravastatin  80 mg Oral Daily  . tamsulosin  0.4 mg Oral Daily  . warfarin  7.5 mg Oral Daily  . Warfarin - Pharmacist Dosing Inpatient   Does not apply q1800   Continuous Infusions: . ampicillin-sulbactam (UNASYN) IV Stopped (10/06/17 1233)  . vancomycin Stopped (10/06/17 1045)   PRN Meds:.acetaminophen **OR** acetaminophen, furosemide, HYDROcodone-acetaminophen, ondansetron **OR** ondansetron (ZOFRAN) IV, zolpidem   Data Review:   Micro Results Recent Results (from the past 240 hour(s))  Blood culture (routine x 2)     Status: None   Collection Time: 09/29/17  4:34 PM  Result Value Ref Range Status   Specimen Description   Final    BLOOD Blood Culture results may not be optimal due to an excessive volume of blood received in culture bottles   Special Requests   Final    BOTTLES DRAWN AEROBIC AND ANAEROBIC LEFT ANTECUBITAL   Culture NO GROWTH 5 DAYS  Final   Report Status 10/04/2017 FINAL  Final  Blood culture (routine x 2)     Status: None  Collection Time: 09/29/17  4:34 PM  Result Value Ref Range Status    Specimen Description BLOOD Blood Culture adequate volume  Final   Special Requests   Final    BOTTLES DRAWN AEROBIC AND ANAEROBIC RIGHT ANTECUBITAL   Culture NO GROWTH 5 DAYS  Final   Report Status 10/04/2017 FINAL  Final    Radiology Reports [place] Venous Img Lower Bilateral  Result Date: 10/05/2017 CLINICAL DATA:  83 year old male with bilateral lower extremity swelling EXAM: BILATERAL LOWER EXTREMITY VENOUS DOPPLER ULTRASOUND TECHNIQUE: Gray-scale sonography with graded compression, as well as color Doppler and duplex ultrasound were performed to evaluate the lower extremity deep venous systems from the level of the common femoral vein and including the common femoral, femoral, profunda femoral, popliteal and calf veins including the posterior tibial, peroneal and gastrocnemius veins when visible. The superficial great saphenous vein was also interrogated. Spectral Doppler was utilized to evaluate flow at rest and with distal augmentation maneuvers in the common femoral, femoral and popliteal veins. COMPARISON:  None. FINDINGS: RIGHT LOWER EXTREMITY Common Femoral Vein: No evidence of thrombus. Normal compressibility, respiratory phasicity and response to augmentation. Increased pulsatility of the venous waveform. Saphenofemoral Junction: No evidence of thrombus. Normal compressibility and flow on color Doppler imaging. Profunda Femoral Vein: No evidence of thrombus. Normal compressibility and flow on color Doppler imaging. Femoral Vein: No evidence of thrombus. Normal compressibility, respiratory phasicity and response to augmentation. Popliteal Vein: No evidence of thrombus. Normal compressibility, respiratory phasicity and response to augmentation. Calf Veins: No evidence of thrombus. Normal compressibility and flow on color Doppler imaging. Superficial Great Saphenous Vein: No evidence of thrombus. Normal compressibility. Venous Reflux:  None. Other Findings:  None. LEFT LOWER EXTREMITY Common Femoral  Vein: No evidence of thrombus. Normal compressibility, respiratory phasicity and response to augmentation. Increased pulsatility of the venous waveform. Saphenofemoral Junction: No evidence of thrombus. Normal compressibility and flow on color Doppler imaging. Profunda Femoral Vein: No evidence of thrombus. Normal compressibility and flow on color Doppler imaging. Femoral Vein: No evidence of thrombus. Normal compressibility, respiratory phasicity and response to augmentation. Popliteal Vein: No evidence of thrombus. Normal compressibility, respiratory phasicity and response to augmentation. Calf Veins: No evidence of thrombus. Normal compressibility and flow on color Doppler imaging. Superficial Great Saphenous Vein: No evidence of thrombus. Normal compressibility. Venous Reflux:  None. Other Findings:  None. IMPRESSION: 1. No evidence of deep venous thrombosis in either lower extremity. 2. Increased pulsatility of the venous waveforms bilaterally as can be seen in the setting of elevated right heart pressures. Common considerations include tricuspid regurgitation, right heart failure, pulmonary hypertension and COPD. Electronically Signed   By: Malachy MoanHeath  McCullough M.D.   On: 10/05/2017 13:41   Dg Foot Complete Right  Result Date: 09/29/2017 CLINICAL DATA:  Right fifth toe pain. EXAM: RIGHT FOOT COMPLETE - 3+ VIEW COMPARISON:  None. FINDINGS: There is no evidence of acute fracture or dislocation. There is no evidence of arthropathy. Mild posterior calcaneal spurring is noted. No lytic destruction is seen to suggest osteomyelitis. Vascular calcifications are noted. Dorsal soft tissue swelling is noted suggesting inflammation. IMPRESSION: Dorsal soft tissue swelling is noted suggesting inflammation. No lytic destruction is seen to suggest osteomyelitis. Electronically Signed   By: Lupita RaiderJames  Green Jr, M.D.   On: 09/29/2017 15:54     CBC Recent Labs  Lab 09/29/17 1513 10/04/17 1244 10/06/17 0457  WBC 7.0 6.7  5.2  HGB 14.1 13.1 12.5*  HCT 42.2 39.0* 37.6*  PLT 112* 145* 97*  MCV 93.3 93.4 93.0  MCH 31.1 31.3 30.9  MCHC 33.4 33.5 33.3  RDW 14.3 14.4 14.3  LYMPHSABS 1.5  --   --   MONOABS 0.6  --   --   EOSABS 0.1  --   --   BASOSABS 0.1  --   --     Chemistries  Recent Labs  Lab 09/29/17 1513 10/04/17 1244 10/06/17 0457  NA 139 140  --   K 4.7 4.3  --   CL 104 106  --   CO2 27 25  --   GLUCOSE 141* 156*  --   BUN 38* 34*  --   CREATININE 1.33* 1.64* 1.14  CALCIUM 9.5 8.7*  --   AST 31 32  --   ALT 40 31  --   ALKPHOS 111 111  --   BILITOT 0.8 1.3*  --    ------------------------------------------------------------------------------------------------------------------ estimated creatinine clearance is 66 mL/min (by C-G formula based on SCr of 1.14 mg/dL). ------------------------------------------------------------------------------------------------------------------ No results for input(s): HGBA1C in the last 72 hours. ------------------------------------------------------------------------------------------------------------------ No results for input(s): CHOL, HDL, LDLCALC, TRIG, CHOLHDL, LDLDIRECT in the last 72 hours. ------------------------------------------------------------------------------------------------------------------ No results for input(s): TSH, T4TOTAL, T3FREE, THYROIDAB in the last 72 hours.  Invalid input(s): FREET3 ------------------------------------------------------------------------------------------------------------------ No results for input(s): VITAMINB12, FOLATE, FERRITIN, TIBC, IRON, RETICCTPCT in the last 72 hours.  Coagulation profile Recent Labs  Lab 10/04/17 2117 10/06/17 0457  INR 2.51 2.57    No results for input(s): DDIMER in the last 72 hours.  Cardiac Enzymes No results for input(s): CKMB, TROPONINI, MYOGLOBIN in the last 168 hours.  Invalid input(s):  CK ------------------------------------------------------------------------------------------------------------------ Invalid input(s): POCBNP    Assessment & Plan   83 year old male with past medical history of diabetes, hypertension, atrial fibrillation who presents to the hospital due to right foot pain.  1.  Right foot cellulitis Patient symptoms are improving Continue 1 more day of vancomycin and Unasyn  2. Dyspnea echo of heart  3. History of atrial fibrillation-rate controlled.  Continue metoprolol.  Continue Coumadin.  4.  Essential hypertension- continue metoprolol, lisinopril.  5.  Diabetes type 2 without complication- continue Glipizide, Actos.    6. BPH - cont. Flomax.   6. Hyperlipidemia - cont. Pravachol.        Code Status Orders  (From admission, onward)        Start     Ordered   10/04/17 2051  Full code  Continuous     10/04/17 2050    Code Status History    Date Active Date Inactive Code Status Order ID Comments User Context   This patient has a current code status but no historical code status.           Consults  none   DVT Prophylaxis  Lovenox    Lab Results  Component Value Date   PLT 97 (L) 10/06/2017     Time Spent in minutes   Greater than 50% of time spent in care coordination and counseling patient regarding the condition and plan of care.   Auburn Bilberry M.D on 10/06/2017 at 1:16 PM  Between 7am to 6pm - Pager - [phone]  After 6pm go to [email or web address] - password [email or web address] [facility]  [facility] [place] [facility]   Office  [phone]

## 2017-10-07 LAB — BASIC METABOLIC PANEL
ANION GAP: 8 (ref 5–15)
ANION GAP: 8 (ref 5–15)
BUN: 19 mg/dL (ref 6–20)
BUN: 17 mg/dL (ref 6–20)
CALCIUM: 8.3 mg/dL — AB (ref 8.9–10.3)
CHLORIDE: 106 mmol/L (ref 101–111)
CO2: 26 mmol/L (ref 22–32)
CO2: 25 mmol/L (ref 22–32)
Calcium: 8.2 mg/dL — ABNORMAL LOW (ref 8.9–10.3)
Chloride: 105 mmol/L (ref 101–111)
Creatinine, Ser: 1.2 mg/dL (ref 0.61–1.24)
Creatinine, Ser: 1.24 mg/dL (ref 0.61–1.24)
GFR calc non Af Amer: 54 mL/min — ABNORMAL LOW (ref >60)
GFR, EST NON AFRICAN AMERICAN: 52 mL/min — AB (ref >60)
GLUCOSE: 123 mg/dL — AB (ref 65–99)
GLUCOSE: 134 mg/dL — AB (ref 65–99)
POTASSIUM: 3.5 mmol/L (ref 3.5–5.1)
Potassium: 3.6 mmol/L (ref 3.5–5.1)
Sodium: 140 mmol/L (ref 135–145)
Sodium: 138 mmol/L (ref 135–145)

## 2017-10-07 LAB — CBC
HEMATOCRIT: 36.4 % — AB (ref 40.0–52.0)
HEMOGLOBIN: 12.5 g/dL — AB (ref 13.0–18.0)
MCH: 31.8 pg (ref 26.0–34.0)
MCHC: 34.4 g/dL (ref 32.0–36.0)
MCV: 92.4 fL (ref 80.0–100.0)
Platelets: 119 10*3/uL — ABNORMAL LOW (ref 150–440)
RBC: 3.94 MIL/uL — ABNORMAL LOW (ref 4.40–5.90)
RDW: 14 % (ref 11.5–14.5)
WBC: 4.7 10*3/uL (ref 3.8–10.6)

## 2017-10-07 LAB — ECHOCARDIOGRAM COMPLETE
Height: 74 in
Weight: 3888 oz

## 2017-10-07 LAB — VANCOMYCIN, TROUGH: Vancomycin Tr: 26 ug/mL (ref 15–20)

## 2017-10-07 LAB — PROTIME-INR
INR: 2.99
Prothrombin Time: 30.8 seconds — ABNORMAL HIGH (ref 11.4–15.2)

## 2017-10-07 MED ORDER — SODIUM CHLORIDE 0.9% FLUSH
3.0000 mL | Freq: Two times a day (BID) | INTRAVENOUS | Status: DC
  Administered 2017-10-07 – 2017-10-10 (×8): 3 mL via INTRAVENOUS

## 2017-10-07 MED ORDER — WARFARIN SODIUM 5 MG PO TABS
5.0000 mg | ORAL_TABLET | Freq: Once | ORAL | Status: AC
  Administered 2017-10-07: 5 mg via ORAL
  Filled 2017-10-07: qty 1

## 2017-10-07 MED ORDER — SODIUM CHLORIDE 0.9% FLUSH
3.0000 mL | INTRAVENOUS | Status: DC | PRN
  Administered 2017-10-08: 18:00:00 3 mL via INTRAVENOUS
  Filled 2017-10-07: qty 3

## 2017-10-07 NOTE — Progress Notes (Signed)
ANTICOAGULATION CONSULT NOTE - follow up consult  Pharmacy Consult for warfarin Indication: atrial fibrillation  No Known Allergies  Patient Measurements: Height: 6\' 2"  (188 cm) Weight: 243 lb (110.2 kg) IBW/kg (Calculated) : 82.2  Vital Signs: Temp: 97.5 F (36.4 C) (11/10 0545) Temp Source: Oral (11/10 0545) BP: 148/72 (11/10 0545) Pulse Rate: 66 (11/10 0545)  Labs: Recent Labs    10/04/17 1244 10/04/17 2117 10/06/17 0457 10/07/17 0501  HGB 13.1  --  12.5* 12.5*  HCT 39.0*  --  37.6* 36.4*  PLT 145*  --  97* 119*  LABPROT  --  26.9* 27.4* 30.8*  INR  --  2.51 2.57 2.99  CREATININE 1.64*  --  1.14 1.20    Estimated Creatinine Clearance: 62.7 mL/min (by C-G formula based on SCr of 1.2 mg/dL).  Medical History: Past Medical History:  Diagnosis Date  . Atrial fibrillation (HCC)   . Diabetes (HCC)   . Hypertension    Assessment: Pharmacy consult entered to dose and monitor warfarin in this 83 year old male who was taking warfarin PTA for atrial fibrillation. INR = 2.5 was therapeutic on admission  Home regimen: Warfarin 7.5 mg PO daily  Dosing history: Date INR Dose 11/7 2.5 7.5 mg  11/8 - 7.5 mg 11/9 2.5       7.5 mg 11/10   2.99  Goal of Therapy:  INR 2-3 Monitor platelets by anticoagulation protocol: Yes   Plan:  INR = 2.99 remains therapeutic. Will decrease dose of warfarin to 5 mg PO daily as patient is also on antibiotics at this time. Will recheck INR with AM labs tomorrow.  Stormy CardKatsoudas,Makayah Pauli K, [facility] Tampa Surgery CenterRPH Clinical Pharmacist 10/07/2017,9:48 AM

## 2017-10-07 NOTE — Progress Notes (Signed)
[employer] [facility] [place] [facility]                                                                                                                                                                                  Patient Demographics   Joseph Cuevas, is a 83 y.o. male, DOB - 15-Apr-1933, ZDG:[id number]  Admit date - 10/04/2017   Admitting Physician Houston SirenVivek J Sainani, MD  Outpatient Primary MD for the patient is System, Pcp Not In   LOS - 2  Subjective: Patient swelling and redness improving,but still hurting He complains of chronic shortness of breath   Review of Systems:   CONSTITUTIONAL: No documented fever. No fatigue, weakness. No weight gain, no weight loss.  EYES: No blurry or double vision.  ENT: No tinnitus. No postnasal drip. No redness of the oropharynx.  RESPIRATORY: No cough, no wheeze, no hemoptysis.  Positive dyspnea.  CARDIOVASCULAR: No chest pain. No orthopnea. No palpitations. No syncope.  GASTROINTESTINAL: No nausea, no vomiting or diarrhea. No abdominal pain. No melena or hematochezia.  GENITOURINARY: No dysuria or hematuria.  ENDOCRINE: No polyuria or nocturia. No heat or cold intolerance.  HEMATOLOGY: No anemia. No bruising. No bleeding.  INTEGUMENTARY: Right foot erythema and redness MUSCULOSKELETAL: No arthritis. No swelling. No gout.  NEUROLOGIC: No numbness, tingling, or ataxia. No seizure-type activity.  PSYCHIATRIC: No anxiety. No insomnia. No ADD.    Vitals:   Vitals:   10/06/17 0846 10/06/17 1414 10/06/17 2056 10/07/17 0545  BP: (!) 151/61 (!) 149/58 (!) 156/71 (!) 148/72  Pulse: 77 (!) 58 81 66  Resp: 14  16 16   Temp: (!) 97.5 F (36.4 C) 97.6 F (36.4 C) (!) 97.5 F (36.4 C) (!) 97.5 F (36.4 C)  TempSrc: Oral Oral Oral Oral  SpO2: 95% 98% 99% 98%  Weight:      Height:        Wt Readings from Last 3 Encounters:  10/04/17 110.2 kg (243 lb)  09/29/17 95.3 kg (210 lb)  09/29/17 97.5 kg (215 lb)     Intake/Output  Summary (Last 24 hours) at 10/07/2017 1414 Last data filed at 10/07/2017 1348 Gross per 24 hour  Intake 1460 ml  Output 1300 ml  Net 160 ml    Physical Exam:   GENERAL: Pleasant-appearing in no apparent distress.  HEAD, EYES, EARS, NOSE AND THROAT: Atraumatic, normocephalic. Extraocular muscles are intact. Pupils equal and reactive to light. Sclerae anicteric. No conjunctival injection. No oro-pharyngeal erythema.  NECK: Supple. There is no jugular venous distention. No bruits, no lymphadenopathy, no thyromegaly.  HEART: Regular rate and rhythm,. No murmurs, no rubs, no clicks.  LUNGS: Clear to auscultation bilaterally. No  rales or rhonchi. No wheezes.  ABDOMEN: Soft, flat, nontender, nondistended. Has good bowel sounds. No hepatosplenomegaly appreciated.  EXTREMITIES: Bilateral lower extremity swelling left greater than right, right foot there is erythema left foot there is an area of erythema-erythema improved NEUROLOGIC: The patient is alert, awake, and oriented x3 with no focal motor or sensory deficits appreciated bilaterally.  SKIN: Moist and warm with no rashes appreciated.  Psych: Not anxious, depressed LN: No inguinal LN enlargement    Antibiotics   Anti-infectives (From admission, onward)   Start     Dose/Rate Route Frequency Ordered Stop   10/06/17 1000  vancomycin (VANCOCIN) IVPB 1000 mg/200 mL premix     1,000 mg 200 mL/hr over 60 Minutes Intravenous Every 12 hours 10/06/17 0907     10/05/17 2200  vancomycin (VANCOCIN) 1,250 mg in sodium chloride 0.9 % 250 mL IVPB  Status:  Discontinued     1,250 mg 166.7 mL/hr over 90 Minutes Intravenous Every 24 hours 10/05/17 0916 10/06/17 0906   10/05/17 1230  Ampicillin-Sulbactam (UNASYN) 3 g in sodium chloride 0.9 % 100 mL IVPB     3 g 200 mL/hr over 30 Minutes Intravenous Every 6 hours 10/05/17 1110     10/04/17 2200  vancomycin (VANCOCIN) 1,250 mg in sodium chloride 0.9 % 250 mL IVPB  Status:  Discontinued     1,250 mg 166.7  mL/hr over 90 Minutes Intravenous Every 8 hours 10/04/17 2109 10/04/17 2114   10/04/17 2200  vancomycin (VANCOCIN) IVPB 1000 mg/200 mL premix  Status:  Discontinued     1,000 mg 200 mL/hr over 60 Minutes Intravenous Every 8 hours 10/04/17 2114 10/04/17 2123   10/04/17 2200  vancomycin (VANCOCIN) IVPB 1000 mg/200 mL premix  Status:  Discontinued     1,000 mg 200 mL/hr over 60 Minutes Intravenous Every 12 hours 10/04/17 2123 10/05/17 0924   10/04/17 1430  piperacillin-tazobactam (ZOSYN) IVPB 3.375 g     3.375 g 100 mL/hr over 30 Minutes Intravenous  Once 10/04/17 1426 10/04/17 2312   10/04/17 1430  vancomycin (VANCOCIN) IVPB 1000 mg/200 mL premix     1,000 mg 200 mL/hr over 60 Minutes Intravenous  Once 10/04/17 1426 10/04/17 2313      Medications   Scheduled Meds: . glipiZIDE  10 mg Oral QAC breakfast  . lisinopril  20 mg Oral Daily  . metoprolol tartrate  25 mg Oral BID  . pioglitazone  30 mg Oral Daily  . pravastatin  80 mg Oral Daily  . tamsulosin  0.4 mg Oral Daily  . warfarin  5 mg Oral ONCE-1800  . Warfarin - Pharmacist Dosing Inpatient   Does not apply q1800   Continuous Infusions: . ampicillin-sulbactam (UNASYN) IV Stopped (10/07/17 1250)  . vancomycin Stopped (10/07/17 1030)   PRN Meds:.acetaminophen **OR** acetaminophen, furosemide, HYDROcodone-acetaminophen, ondansetron **OR** ondansetron (ZOFRAN) IV, zolpidem   Data Review:   Micro Results Recent Results (from the past 240 hour(s))  Blood culture (routine x 2)     Status: None   Collection Time: 09/29/17  4:34 PM  Result Value Ref Range Status   Specimen Description   Final    BLOOD Blood Culture results may not be optimal due to an excessive volume of blood received in culture bottles   Special Requests   Final    BOTTLES DRAWN AEROBIC AND ANAEROBIC LEFT ANTECUBITAL   Culture NO GROWTH 5 DAYS  Final   Report Status 10/04/2017 FINAL  Final  Blood culture (routine x 2)  Status: None   Collection Time:  09/29/17  4:34 PM  Result Value Ref Range Status   Specimen Description BLOOD Blood Culture adequate volume  Final   Special Requests   Final    BOTTLES DRAWN AEROBIC AND ANAEROBIC RIGHT ANTECUBITAL   Culture NO GROWTH 5 DAYS  Final   Report Status 10/04/2017 FINAL  Final    Radiology Reports US Venous Img Lower Bilateral  Result Date: 10/05/2017 CLINICAL DATA:  83 year old male with bilateral lower extremity swelling EXAM: BILATERAL LOWER EXTREMITY VENOUS DOPPLER ULTRASOUND TECHNIQUE: Gray-scale sonography with graded compression, as well as color Doppler and duplex ultrasound were performed to evaluate the lower extremity deep venous systems from the level of the common femoral vein and including the common femoral, femoral, profunda femoral, popliteal and calf veins including the posterior tibial, peroneal and gastrocnemius veins when visible. The superficial great saphenous vein was also interrogated. Spectral Doppler was utilized to evaluate flow at rest and with distal augmentation maneuvers in the common femoral, femoral and popliteal veins. COMPARISON:  None. FINDINGS: RIGHT LOWER EXTREMITY Common Femoral Vein: No evidence of thrombus. Normal compressibility, respiratory phasicity and response to augmentation. Increased pulsatility of the venous waveform. Saphenofemoral Junction: No evidence of thrombus. Normal compressibility and flow on color Doppler imaging. Profunda Femoral Vein: No evidence of thrombus. Normal compressibility and flow on color Doppler imaging. Femoral Vein: No evidence of thrombus. Normal compressibility, respiratory phasicity and response to augmentation. Popliteal Vein: No evidence of thrombus. Normal compressibility, respiratory phasicity and response to augmentation. Calf Veins: No evidence of thrombus. Normal compressibility and flow on color Doppler imaging. Superficial Great Saphenous Vein: No evidence of thrombus. Normal compressibility. Venous Reflux:  None. Other  Findings:  None. LEFT LOWER EXTREMITY Common Femoral Vein: No evidence of thrombus. Normal compressibility, respiratory phasicity and response to augmentation. Increased pulsatility of the venous waveform. Saphenofemoral Junction: No evidence of thrombus. Normal compressibility and flow on color Doppler imaging. Profunda Femoral Vein: No evidence of thrombus. Normal compressibility and flow on color Doppler imaging. Femoral Vein: No evidence of thrombus. Normal compressibility, respiratory phasicity and response to augmentation. Popliteal Vein: No evidence of thrombus. Normal compressibility, respiratory phasicity and response to augmentation. Calf Veins: No evidence of thrombus. Normal compressibility and flow on color Doppler imaging. Superficial Great Saphenous Vein: No evidence of thrombus. Normal compressibility. Venous Reflux:  None. Other Findings:  None. IMPRESSION: 1. No evidence of deep venous thrombosis in either lower extremity. 2. Increased pulsatility of the venous waveforms bilaterally as can be seen in the setting of elevated right heart pressures. Common considerations include tricuspid regurgitation, right heart failure, pulmonary hypertension and COPD. Electronically Signed   By: Malachy Moan M.D.   On: 10/05/2017 13:41   Dg Foot Complete Right  Result Date: 09/29/2017 CLINICAL DATA:  Right fifth toe pain. EXAM: RIGHT FOOT COMPLETE - 3+ VIEW COMPARISON:  None. FINDINGS: There is no evidence of acute fracture or dislocation. There is no evidence of arthropathy. Mild posterior calcaneal spurring is noted. No lytic destruction is seen to suggest osteomyelitis. Vascular calcifications are noted. Dorsal soft tissue swelling is noted suggesting inflammation. IMPRESSION: Dorsal soft tissue swelling is noted suggesting inflammation. No lytic destruction is seen to suggest osteomyelitis. Electronically Signed   By: Lupita Raider, M.D.   On: 09/29/2017 15:54     CBC Recent Labs  Lab  10/04/17 1244 10/06/17 0457 10/07/17 0501  WBC 6.7 5.2 4.7  HGB 13.1 12.5* 12.5*  HCT 39.0* 37.6* 36.4*  PLT  145* 97* 119*  MCV 93.4 93.0 92.4  MCH 31.3 30.9 31.8  MCHC 33.5 33.3 34.4  RDW 14.4 14.3 14.0    Chemistries  Recent Labs  Lab 10/04/17 1244 10/06/17 0457 10/07/17 0501  NA 140  --  140  K 4.3  --  3.6  CL 106  --  106  CO2 25  --  26  GLUCOSE 156*  --  123*  BUN 34*  --  19  CREATININE 1.64* 1.14 1.20  CALCIUM 8.7*  --  8.3*  AST 32  --   --   ALT 31  --   --   ALKPHOS 111  --   --   BILITOT 1.3*  --   --    ------------------------------------------------------------------------------------------------------------------ estimated creatinine clearance is 62.7 mL/min (by C-G formula based on SCr of 1.2 mg/dL). ------------------------------------------------------------------------------------------------------------------ No results for input(s): HGBA1C in the last 72 hours. ------------------------------------------------------------------------------------------------------------------ No results for input(s): CHOL, HDL, LDLCALC, TRIG, CHOLHDL, LDLDIRECT in the last 72 hours. ------------------------------------------------------------------------------------------------------------------ No results for input(s): TSH, T4TOTAL, T3FREE, THYROIDAB in the last 72 hours.  Invalid input(s): FREET3 ------------------------------------------------------------------------------------------------------------------ No results for input(s): VITAMINB12, FOLATE, FERRITIN, TIBC, IRON, RETICCTPCT in the last 72 hours.  Coagulation profile Recent Labs  Lab 10/04/17 2117 10/06/17 0457 10/07/17 0501  INR 2.51 2.57 2.99    No results for input(s): DDIMER in the last 72 hours.  Cardiac Enzymes No results for input(s): CKMB, TROPONINI, MYOGLOBIN in the last 168 hours.  Invalid input(s):  CK ------------------------------------------------------------------------------------------------------------------ Invalid input(s): POCBNP    Assessment & Plan   83 year old male with past medical history of diabetes, hypertension, atrial fibrillation who presents to the hospital due to right foot pain.  1.  Right foot cellulitis Patient symptoms are improving Continue 1 more day of vancomycin and Unasyn, expect discharge tomorrow with p.o. Augmentin  2. Dyspnea echo of heart 50-55% ejection fraction.  Systolic function was normal mild mitral valve regurgitation and trivial  aortic regurgitation  3. History of atrial fibrillation-rate controlled.  Continue metoprolol.  Continue Coumadin. 2.99-iNR  4.  Essential hypertension- continue metoprolol, lisinopril.  5.  Diabetes type 2 without complication- continue Glipizide, Actos.    6. BPH - cont. Flomax.   6. Hyperlipidemia - cont. Pravachol.        Code Status Orders  (From admission, onward)        Start     Ordered   10/04/17 2051  Full code  Continuous     10/04/17 2050    Code Status History    Date Active Date Inactive Code Status Order ID Comments User Context   This patient has a current code status but no historical code status.           Consults  none   DVT Prophylaxis  Lovenox    Lab Results  Component Value Date   PLT 119 (L) 10/07/2017     Time Spent in minutes   35min Greater than 50% of time spent in care coordination and counseling patient regarding the condition and plan of care.   Ramonita LabGouru, Elon Eoff M.D on 10/07/2017 at 2:14 PM  Between 7am to 6pm - Pager - [phone]  After 6pm go to [email or web address] - password [email or web address] [facility]  [facility]   Office  [phone]

## 2017-10-08 DIAGNOSIS — L03115 Cellulitis of right lower limb: Principal | ICD-10-CM

## 2017-10-08 LAB — PROTIME-INR
INR: 3.22
Prothrombin Time: 32.7 seconds — ABNORMAL HIGH (ref 11.4–15.2)

## 2017-10-08 LAB — VANCOMYCIN, RANDOM
Vancomycin Rm: 21
Vancomycin Rm: 17

## 2017-10-08 MED ORDER — VANCOMYCIN HCL IN DEXTROSE 1-5 GM/200ML-% IV SOLN
1000.0000 mg | Freq: Once | INTRAVENOUS | Status: AC
  Administered 2017-10-08: 1000 mg via INTRAVENOUS
  Filled 2017-10-08: qty 200

## 2017-10-08 NOTE — Consult Note (Signed)
Reason for Consult: Dr. Margaretmary Eddy Referring Physician: Chronic Right  Joseph Cuevas is an 83 y.o. male.  HPI: pleasant 89 yom history of AFIB, DM, chronic right foot wounds followed in [place] closely by Podiatry and his PCP. The patient is here visiting. He states the foot became more edematous, tender with some ulcerations that are chronic. Upon my exam and discussion he feels much better. He does not ambulate much but denies claudication or rest pain. He is limited secondary to his chronic foot ulcers.  Past Medical History:  Diagnosis Date  . Atrial fibrillation ([place])   . Diabetes ([place])   . Hypertension     Past Surgical History:  Procedure Laterality Date  . BACK SURGERY  1981,1991   X 3  . CARDIAC SURGERY  2012    Family History  Problem Relation Age of Onset  . Liver cancer Mother     Social History:  reports that  has never smoked. he has never used smokeless tobacco. He reports that he does not drink alcohol or use drugs.  Allergies: No Known Allergies  Medications: I have reviewed the patient's current medications.  Results for orders placed or performed during the hospital encounter of 10/04/17 (from the past 48 hour(s))  Protime-INR     Status: Abnormal   Collection Time: 10/07/17  5:01 AM  Result Value Ref Range   Prothrombin Time 30.8 (H) 11.4 - 15.2 seconds   INR 2.99   CBC     Status: Abnormal   Collection Time: 10/07/17  5:01 AM  Result Value Ref Range   WBC 4.7 3.8 - 10.6 K/uL   RBC 3.94 (L) 4.40 - 5.90 MIL/uL   Hemoglobin 12.5 (L) 13.0 - 18.0 g/dL   HCT 36.4 (L) 40.0 - 52.0 %   MCV 92.4 80.0 - 100.0 fL   MCH 31.8 26.0 - 34.0 pg   MCHC 34.4 32.0 - 36.0 g/dL   RDW 14.0 11.5 - 14.5 %   Platelets 119 (L) 150 - 440 K/uL  Basic metabolic panel     Status: Abnormal   Collection Time: 10/07/17  5:01 AM  Result Value Ref Range   Sodium 140 135 - 145 mmol/L   Potassium 3.6 3.5 - 5.1 mmol/L   Chloride 106 101 - 111 mmol/L   CO2 26 22 - 32 mmol/L   Glucose,  Bld 123 (H) 65 - 99 mg/dL   BUN 19 6 - 20 mg/dL   Creatinine, Ser 1.20 0.61 - 1.24 mg/dL   Calcium 8.3 (L) 8.9 - 10.3 mg/dL   GFR calc non Af Amer 54 (L) >60 mL/min   GFR calc Af Amer >60 >60 mL/min    Comment: (NOTE) The eGFR has been calculated using the CKD EPI equation. This calculation has not been validated in all clinical situations. eGFR's persistently <60 mL/min signify possible Chronic Kidney Disease.    Anion gap 8 5 - 15  Vancomycin, trough     Status: Abnormal   Collection Time: 10/07/17  9:16 PM  Result Value Ref Range   Vancomycin Tr 26 (HH) 15 - 20 ug/mL    Comment: CRITICAL RESULT CALLED TO, READ BACK BY AND VERIFIED WITH DAVID BESANTI AT 2152 ON 10/07/17 RWW   Basic metabolic panel     Status: Abnormal   Collection Time: 10/07/17  9:16 PM  Result Value Ref Range   Sodium 138 135 - 145 mmol/L   Potassium 3.5 3.5 - 5.1 mmol/L   Chloride 105 101 -  111 mmol/L   CO2 25 22 - 32 mmol/L   Glucose, Bld 134 (H) 65 - 99 mg/dL   BUN 17 6 - 20 mg/dL   Creatinine, Ser 1.24 0.61 - 1.24 mg/dL   Calcium 8.2 (L) 8.9 - 10.3 mg/dL   GFR calc non Af Amer 52 (L) >60 mL/min   GFR calc Af Amer >60 >60 mL/min    Comment: (NOTE) The eGFR has been calculated using the CKD EPI equation. This calculation has not been validated in all clinical situations. eGFR's persistently <60 mL/min signify possible Chronic Kidney Disease.    Anion gap 8 5 - 15  Protime-INR     Status: Abnormal   Collection Time: 10/08/17  5:17 AM  Result Value Ref Range   Prothrombin Time 32.7 (H) 11.4 - 15.2 seconds   INR 3.22   Vancomycin, random     Status: None   Collection Time: 10/08/17  5:17 AM  Result Value Ref Range   Vancomycin Rm 21     Comment:        Random Vancomycin therapeutic range is dependent on dosage and time of specimen collection. A peak range is 20.0-40.0 ug/mL A trough range is 5.0-15.0 ug/mL            No results found.  Review of Systems  Constitutional: Positive for  malaise/fatigue.  HENT: Negative.   Respiratory: Negative.   Cardiovascular: Positive for leg swelling. Negative for chest pain and claudication.  Gastrointestinal: Negative.   Skin: Positive for rash.   Blood pressure (!) 167/94, pulse 71, temperature 97.6 F (36.4 C), temperature source Oral, resp. rate 20, height _0  (1.88 m), weight 110.2 kg (243 lb), SpO2 100 %. Physical Exam  Nursing note and vitals reviewed. Constitutional: He is oriented to person, place, and time. He appears well-developed and well-nourished. No distress.  Cardiovascular: Normal rate.  Monophasic DP/PT  Respiratory: Effort normal and breath sounds normal.  GI: Soft. Bowel sounds are normal.  Musculoskeletal: Normal range of motion. He exhibits edema.  Chronic right foot ulcerations  Neurological: He is alert and oriented to person, place, and time.  Skin: Skin is warm and dry. There is erythema.    Assessment/Plan: Chronic Right foot ulcerations- Diabetic. No evidence of ischemia or acute Vascular pathology warranting intervention or investigation at this time. OK to D/C as planned. Patient should follow up with his Podiatrist and Vascular Surgeon at home in [place].  Elira Colasanti A 10/08/2017, 12:31 PM

## 2017-10-08 NOTE — Progress Notes (Signed)
ANTIBIOTIC CONSULT NOTE - INITIAL  Pharmacy Consult for vancomycin and ampicillin/sulbactam Indication: cellulitis  No Known Allergies  Patient Measurements: Height: 6\' 2"  (188 cm) Weight: 243 lb (110.2 kg) IBW/kg (Calculated) : 82.2 Adjusted Body Weight:  93 kg  Vital Signs: Temp: 97.8 F (36.6 C) (11/11 1310) Temp Source: Oral (11/11 1310) BP: 137/62 (11/11 1310) Pulse Rate: 43 (11/11 1310) Intake/Output from previous day: 11/10 0701 - 11/11 0700 In: 1497.4 [P.O.:860; IV Piggyback:637.4] Out: 800 [Urine:800] Intake/Output from this shift: No intake/output data recorded.  Labs: Recent Labs    10/06/17 0457 10/07/17 0501 10/07/17 2116  WBC 5.2 4.7  --   HGB 12.5* 12.5*  --   PLT 97* 119*  --   CREATININE 1.14 1.20 1.24   Estimated Creatinine Clearance: 60.7 mL/min (by C-G formula based on SCr of 1.24 mg/dL). Recent Labs    10/07/17 2116 10/08/17 0517 10/08/17 1715  VANCOTROUGH 26*  --   --   VANCORANDOM  --  21 17     Microbiology: Recent Results (from the past 720 hour(s))  Blood culture (routine x 2)     Status: None   Collection Time: 09/29/17  4:34 PM  Result Value Ref Range Status   Specimen Description   Final    BLOOD Blood Culture results may not be optimal due to an excessive volume of blood received in culture bottles   Special Requests   Final    BOTTLES DRAWN AEROBIC AND ANAEROBIC LEFT ANTECUBITAL   Culture NO GROWTH 5 DAYS  Final   Report Status 10/04/2017 FINAL  Final  Blood culture (routine x 2)     Status: None   Collection Time: 09/29/17  4:34 PM  Result Value Ref Range Status   Specimen Description BLOOD Blood Culture adequate volume  Final   Special Requests   Final    BOTTLES DRAWN AEROBIC AND ANAEROBIC RIGHT ANTECUBITAL   Culture NO GROWTH 5 DAYS  Final   Report Status 10/04/2017 FINAL  Final     Assessment: Pharmacy consulted to dose and monitor vancomycin for right foot cellulitis in this 83 year old male. Patient was seen  in ED on 11/2 and discharged on clindamycin but reports back with worsening cellulitis.   Kinetics: Using adjusted body weight = 93 kg, CrCl 66 mL/min  Ke: 0.061 Half-life: 11.4 hrs Vd: 65 L Cmin (estimated) = ~12 mcg/mL  Goal of Therapy:  Vancomycin trough level 10-15 mcg/ml  Plan Given improvement in renal function, will have to adjust vancomycin dose.  Will change to vancomycin 1000 mg IV q12h  Will check VT prior to 3rd or 4th dose which will be before steady state is reached, but want to ensure not supratherapeutic given changes in dosing.  Continue ampicillin/sulbactam 3 g IV q6h  11/10 2100 VT 26.0 supratherapeutic. Patient's Scr is rising 1.14 >> 1.20 >> 1.24. Will hold off doses of vanc for now and recheck random level w/ am labs. If random level < 20 mcg/mL will redose 15 mg/kg x 1 and dose off random levels.  11/11 @ 0500 VR 21 slightly therapeutic. Ke based on two levels: 0.0267 Patient specific t1/2: 26 hrs Will hold further doses of vanc and draw another random @ 1700 (which is when level should be down to 15). If level is < 20 mcg/mL will redose w/ 15 mg/kg IV x 1.  11/11 :  Random Vanc @ 17:15 = 17 mcg/mL .    Vanc level has only fallen by  4 in past 12 hrs.  Will give additional vancomycin 1 gm IV X 1 and recheck vanc level on 11/12 @ 17:00.   Scherrie Gerlachobbins,Lanisa Ishler D, PharmD Clinical Pharmacist 10/08/17 7:23 PM

## 2017-10-08 NOTE — Progress Notes (Signed)
ANTIBIOTIC CONSULT NOTE - INITIAL  Pharmacy Consult for vancomycin and ampicillin/sulbactam Indication: cellulitis  No Known Allergies  Patient Measurements: Height: 6\' 2"  (188 cm) Weight: 243 lb (110.2 kg) IBW/kg (Calculated) : 82.2 Adjusted Body Weight:  93 kg  Vital Signs: Temp: 97.6 F (36.4 C) (11/11 0438) Temp Source: Oral (11/11 0438) BP: 167/94 (11/11 0438) Pulse Rate: 71 (11/11 0438) Intake/Output from previous day: 11/10 0701 - 11/11 0700 In: 1397.4 [P.O.:860; IV Piggyback:537.4] Out: 800 [Urine:800] Intake/Output from this shift: Total I/O In: 137.4 [IV Piggyback:137.4] Out: 100 [Urine:100]  Labs: Recent Labs    10/06/17 0457 10/07/17 0501 10/07/17 2116  WBC 5.2 4.7  --   HGB 12.5* 12.5*  --   PLT 97* 119*  --   CREATININE 1.14 1.20 1.24   Estimated Creatinine Clearance: 60.7 mL/min (by C-G formula based on SCr of 1.24 mg/dL). Recent Labs    10/07/17 2116 10/08/17 0517  VANCOTROUGH 26*  --   VANCORANDOM  --  21     Microbiology: Recent Results (from the past 720 hour(s))  Blood culture (routine x 2)     Status: None   Collection Time: 09/29/17  4:34 PM  Result Value Ref Range Status   Specimen Description   Final    BLOOD Blood Culture results may not be optimal due to an excessive volume of blood received in culture bottles   Special Requests   Final    BOTTLES DRAWN AEROBIC AND ANAEROBIC LEFT ANTECUBITAL   Culture NO GROWTH 5 DAYS  Final   Report Status 10/04/2017 FINAL  Final  Blood culture (routine x 2)     Status: None   Collection Time: 09/29/17  4:34 PM  Result Value Ref Range Status   Specimen Description BLOOD Blood Culture adequate volume  Final   Special Requests   Final    BOTTLES DRAWN AEROBIC AND ANAEROBIC RIGHT ANTECUBITAL   Culture NO GROWTH 5 DAYS  Final   Report Status 10/04/2017 FINAL  Final     Assessment: Pharmacy consulted to dose and monitor vancomycin for right foot cellulitis in this 83 year old male. Patient  was seen in ED on 11/2 and discharged on clindamycin but reports back with worsening cellulitis.   Kinetics: Using adjusted body weight = 93 kg, CrCl 66 mL/min  Ke: 0.061 Half-life: 11.4 hrs Vd: 65 L Cmin (estimated) = ~12 mcg/mL  Goal of Therapy:  Vancomycin trough level 10-15 mcg/ml  Plan Given improvement in renal function, will have to adjust vancomycin dose.  Will change to vancomycin 1000 mg IV q12h  Will check VT prior to 3rd or 4th dose which will be before steady state is reached, but want to ensure not supratherapeutic given changes in dosing.  Continue ampicillin/sulbactam 3 g IV q6h  11/10 2100 VT 26.0 supratherapeutic. Patient's Scr is rising 1.14 >> 1.20 >> 1.24. Will hold off doses of vanc for now and recheck random level w/ am labs. If random level < 20 mcg/mL will redose 15 mg/kg x 1 and dose off random levels.  11/11 @ 0500 VR 21 slightly therapeutic. Ke based on two levels: 0.0267 Patient specific t1/2: 26 hrs Will hold further doses of vanc and draw another random @ 1700 (which is when level should be down to 15). If level is < 20 mcg/mL will redose w/ 15 mg/kg IV x 1.  Thomasene Rippleavid  Makenize Messman, PharmD, BCPS Clinical Pharmacist 10/08/17 6:46 AM

## 2017-10-08 NOTE — Progress Notes (Signed)
[facility] - [place] [facility]                                                                                                                                                                                  Patient Demographics   Joseph Cuevas, is a 83 y.o. male, DOB - March 25, 1933, NWG:[id number]  Admit date - 10/04/2017   Admitting Physician Houston SirenVivek J Sainani, MD  Outpatient Primary MD for the patient is System, Pcp Not In   LOS - 3  Subjective: Patient swelling and redness improving,but still hurting, difficulty with ambulation Has chronic shortness of breath   Review of Systems:   CONSTITUTIONAL: No documented fever. No fatigue, weakness. No weight gain, no weight loss.  EYES: No blurry or double vision.  ENT: No tinnitus. No postnasal drip. No redness of the oropharynx.  RESPIRATORY: No cough, no wheeze, no hemoptysis.  Positive dyspnea.  CARDIOVASCULAR: No chest pain. No orthopnea. No palpitations. No syncope.  GASTROINTESTINAL: No nausea, no vomiting or diarrhea. No abdominal pain. No melena or hematochezia.  GENITOURINARY: No dysuria or hematuria.  ENDOCRINE: No polyuria or nocturia. No heat or cold intolerance.  HEMATOLOGY: No anemia. No bruising. No bleeding.  INTEGUMENTARY: Right foot erythema and redness MUSCULOSKELETAL: No arthritis. No swelling. No gout.  NEUROLOGIC: No numbness, tingling, or ataxia. No seizure-type activity.  PSYCHIATRIC: No anxiety. No insomnia. No ADD.    Vitals:   Vitals:   10/07/17 1449 10/07/17 1510 10/07/17 2022 10/08/17 0438  BP: 140/71 (!) 171/88 (!) 159/76 (!) 167/94  Pulse: (!) 57 70 75 71  Resp:  16 16 20   Temp: (!) 97.3 F (36.3 C) (!) 97.5 F (36.4 C) 97.6 F (36.4 C) 97.6 F (36.4 C)  TempSrc: Oral Oral Oral Oral  SpO2: 100% 99% 97% 100%  Weight:      Height:        Wt Readings from Last 3 Encounters:  10/04/17 110.2 kg (243 lb)  09/29/17 95.3 kg (210 lb)  09/29/17 97.5 kg (215 lb)      Intake/Output Summary (Last 24 hours) at 10/08/2017 1227 Last data filed at 10/08/2017 1123 Gross per 24 hour  Intake 1017.35 ml  Output 250 ml  Net 767.35 ml    Physical Exam:   GENERAL: Pleasant-appearing in no apparent distress.  HEAD, EYES, EARS, NOSE AND THROAT: Atraumatic, normocephalic. Extraocular muscles are intact. Pupils equal and reactive to light. Sclerae anicteric. No conjunctival injection. No oro-pharyngeal erythema.  NECK: Supple. There is no jugular venous distention. No bruits, no lymphadenopathy, no thyromegaly.  HEART: Regular rate and rhythm,. No murmurs, no rubs, no clicks.  LUNGS: Clear to auscultation bilaterally. No rales  or rhonchi. No wheezes.  ABDOMEN: Soft, flat, nontender, nondistended. Has good bowel sounds. No hepatosplenomegaly appreciated.  EXTREMITIES: Bilateral lower extremity swelling left greater than right, right foot min   erythema left foot there is an area of erythema-erythema improved NEUROLOGIC: The patient is alert, awake, and oriented x3 with no focal motor or sensory deficits appreciated bilaterally.  SKIN: Moist and warm with no rashes appreciated.  Psych: Not anxious, depressed LN: No inguinal LN enlargement    Antibiotics   Anti-infectives (From admission, onward)   Start     Dose/Rate Route Frequency Ordered Stop   10/06/17 1000  vancomycin (VANCOCIN) IVPB 1000 mg/200 mL premix  Status:  Discontinued     1,000 mg 200 mL/hr over 60 Minutes Intravenous Every 12 hours 10/06/17 0907 10/07/17 2201   10/05/17 2200  vancomycin (VANCOCIN) 1,250 mg in sodium chloride 0.9 % 250 mL IVPB  Status:  Discontinued     1,250 mg 166.7 mL/hr over 90 Minutes Intravenous Every 24 hours 10/05/17 0916 10/06/17 0906   10/05/17 1230  Ampicillin-Sulbactam (UNASYN) 3 g in sodium chloride 0.9 % 100 mL IVPB     3 g 200 mL/hr over 30 Minutes Intravenous Every 6 hours 10/05/17 1110     10/04/17 2200  vancomycin (VANCOCIN) 1,250 mg in sodium chloride  0.9 % 250 mL IVPB  Status:  Discontinued     1,250 mg 166.7 mL/hr over 90 Minutes Intravenous Every 8 hours 10/04/17 2109 10/04/17 2114   10/04/17 2200  vancomycin (VANCOCIN) IVPB 1000 mg/200 mL premix  Status:  Discontinued     1,000 mg 200 mL/hr over 60 Minutes Intravenous Every 8 hours 10/04/17 2114 10/04/17 2123   10/04/17 2200  vancomycin (VANCOCIN) IVPB 1000 mg/200 mL premix  Status:  Discontinued     1,000 mg 200 mL/hr over 60 Minutes Intravenous Every 12 hours 10/04/17 2123 10/05/17 0924   10/04/17 1430  piperacillin-tazobactam (ZOSYN) IVPB 3.375 g     3.375 g 100 mL/hr over 30 Minutes Intravenous  Once 10/04/17 1426 10/04/17 2312   10/04/17 1430  vancomycin (VANCOCIN) IVPB 1000 mg/200 mL premix     1,000 mg 200 mL/hr over 60 Minutes Intravenous  Once 10/04/17 1426 10/04/17 2313      Medications   Scheduled Meds: . glipiZIDE  10 mg Oral QAC breakfast  . lisinopril  20 mg Oral Daily  . metoprolol tartrate  25 mg Oral BID  . pioglitazone  30 mg Oral Daily  . pravastatin  80 mg Oral Daily  . sodium chloride flush  3 mL Intravenous Q12H  . tamsulosin  0.4 mg Oral Daily  . Warfarin - Pharmacist Dosing Inpatient   Does not apply q1800   Continuous Infusions: . ampicillin-sulbactam (UNASYN) IV 3 g (10/08/17 1123)   PRN Meds:.acetaminophen **OR** acetaminophen, furosemide, HYDROcodone-acetaminophen, ondansetron **OR** ondansetron (ZOFRAN) IV, sodium chloride flush, zolpidem   Data Review:   Micro Results Recent Results (from the past 240 hour(s))  Blood culture (routine x 2)     Status: None   Collection Time: 09/29/17  4:34 PM  Result Value Ref Range Status   Specimen Description   Final    BLOOD Blood Culture results may not be optimal due to an excessive volume of blood received in culture bottles   Special Requests   Final    BOTTLES DRAWN AEROBIC AND ANAEROBIC LEFT ANTECUBITAL   Culture NO GROWTH 5 DAYS  Final   Report Status 10/04/2017 FINAL  Final  Blood  culture (  routine x 2)     Status: None   Collection Time: 09/29/17  4:34 PM  Result Value Ref Range Status   Specimen Description BLOOD Blood Culture adequate volume  Final   Special Requests   Final    BOTTLES DRAWN AEROBIC AND ANAEROBIC RIGHT ANTECUBITAL   Culture NO GROWTH 5 DAYS  Final   Report Status 10/04/2017 FINAL  Final    Radiology Reports US Venous Img Lower Bilateral  Result Date: 10/05/2017 CLINICAL DATA:  83 year old male with bilateral lower extremity swelling EXAM: BILATERAL LOWER EXTREMITY VENOUS DOPPLER ULTRASOUND TECHNIQUE: Gray-scale sonography with graded compression, as well as color Doppler and duplex ultrasound were performed to evaluate the lower extremity deep venous systems from the level of the common femoral vein and including the common femoral, femoral, profunda femoral, popliteal and calf veins including the posterior tibial, peroneal and gastrocnemius veins when visible. The superficial great saphenous vein was also interrogated. Spectral Doppler was utilized to evaluate flow at rest and with distal augmentation maneuvers in the common femoral, femoral and popliteal veins. COMPARISON:  None. FINDINGS: RIGHT LOWER EXTREMITY Common Femoral Vein: No evidence of thrombus. Normal compressibility, respiratory phasicity and response to augmentation. Increased pulsatility of the venous waveform. Saphenofemoral Junction: No evidence of thrombus. Normal compressibility and flow on color Doppler imaging. Profunda Femoral Vein: No evidence of thrombus. Normal compressibility and flow on color Doppler imaging. Femoral Vein: No evidence of thrombus. Normal compressibility, respiratory phasicity and response to augmentation. Popliteal Vein: No evidence of thrombus. Normal compressibility, respiratory phasicity and response to augmentation. Calf Veins: No evidence of thrombus. Normal compressibility and flow on color Doppler imaging. Superficial Great Saphenous Vein: No evidence of  thrombus. Normal compressibility. Venous Reflux:  None. Other Findings:  None. LEFT LOWER EXTREMITY Common Femoral Vein: No evidence of thrombus. Normal compressibility, respiratory phasicity and response to augmentation. Increased pulsatility of the venous waveform. Saphenofemoral Junction: No evidence of thrombus. Normal compressibility and flow on color Doppler imaging. Profunda Femoral Vein: No evidence of thrombus. Normal compressibility and flow on color Doppler imaging. Femoral Vein: No evidence of thrombus. Normal compressibility, respiratory phasicity and response to augmentation. Popliteal Vein: No evidence of thrombus. Normal compressibility, respiratory phasicity and response to augmentation. Calf Veins: No evidence of thrombus. Normal compressibility and flow on color Doppler imaging. Superficial Great Saphenous Vein: No evidence of thrombus. Normal compressibility. Venous Reflux:  None. Other Findings:  None. IMPRESSION: 1. No evidence of deep venous thrombosis in either lower extremity. 2. Increased pulsatility of the venous waveforms bilaterally as can be seen in the setting of elevated right heart pressures. Common considerations include tricuspid regurgitation, right heart failure, pulmonary hypertension and COPD. Electronically Signed   By: Malachy Moan M.D.   On: 10/05/2017 13:41   Dg Foot Complete Right  Result Date: 09/29/2017 CLINICAL DATA:  Right fifth toe pain. EXAM: RIGHT FOOT COMPLETE - 3+ VIEW COMPARISON:  None. FINDINGS: There is no evidence of acute fracture or dislocation. There is no evidence of arthropathy. Mild posterior calcaneal spurring is noted. No lytic destruction is seen to suggest osteomyelitis. Vascular calcifications are noted. Dorsal soft tissue swelling is noted suggesting inflammation. IMPRESSION: Dorsal soft tissue swelling is noted suggesting inflammation. No lytic destruction is seen to suggest osteomyelitis. Electronically Signed   By: Lupita Raider, M.D.    On: 09/29/2017 15:54     CBC Recent Labs  Lab 10/04/17 1244 10/06/17 0457 10/07/17 0501  WBC 6.7 5.2 4.7  HGB 13.1 12.5* 12.5*  HCT 39.0* 37.6* 36.4*  PLT 145* 97* 119*  MCV 93.4 93.0 92.4  MCH 31.3 30.9 31.8  MCHC 33.5 33.3 34.4  RDW 14.4 14.3 14.0    Chemistries  Recent Labs  Lab 10/04/17 1244 10/06/17 0457 10/07/17 0501 10/07/17 2116  NA 140  --  140 138  K 4.3  --  3.6 3.5  CL 106  --  106 105  CO2 25  --  26 25  GLUCOSE 156*  --  123* 134*  BUN 34*  --  19 17  CREATININE 1.64* 1.14 1.20 1.24  CALCIUM 8.7*  --  8.3* 8.2*  AST 32  --   --   --   ALT 31  --   --   --   ALKPHOS 111  --   --   --   BILITOT 1.3*  --   --   --    ------------------------------------------------------------------------------------------------------------------ estimated creatinine clearance is 60.7 mL/min (by C-G formula based on SCr of 1.24 mg/dL). ------------------------------------------------------------------------------------------------------------------ No results for input(s): HGBA1C in the last 72 hours. ------------------------------------------------------------------------------------------------------------------ No results for input(s): CHOL, HDL, LDLCALC, TRIG, CHOLHDL, LDLDIRECT in the last 72 hours. ------------------------------------------------------------------------------------------------------------------ No results for input(s): TSH, T4TOTAL, T3FREE, THYROIDAB in the last 72 hours.  Invalid input(s): FREET3 ------------------------------------------------------------------------------------------------------------------ No results for input(s): VITAMINB12, FOLATE, FERRITIN, TIBC, IRON, RETICCTPCT in the last 72 hours.  Coagulation profile Recent Labs  Lab 10/04/17 2117 10/06/17 0457 10/07/17 0501 10/08/17 0517  INR 2.51 2.57 2.99 3.22    No results for input(s): DDIMER in the last 72 hours.  Cardiac Enzymes No results for input(s): CKMB,  TROPONINI, MYOGLOBIN in the last 168 hours.  Invalid input(s): CK ------------------------------------------------------------------------------------------------------------------ Invalid input(s): POCBNP    Assessment & Plan   83 year old male with past medical history of diabetes, hypertension, atrial fibrillation who presents to the hospital due to right foot pain.  1.  Right foot cellulitis Patient symptoms are improving Continue 1 more day of vancomycin and Unasyn, expect discharge tomorrow with p.o. Augmentin Vascular consult as patient has history of peripheral arterial disease  2. Dyspnea echo of heart 50-55% ejection fraction.  Systolic function was normal mild mitral valve regurgitation and trivial  aortic regurgitation  3. History of atrial fibrillation-rate controlled.  Continue metoprolol.  Continue Coumadin. 2.99-iNR  4.  Essential hypertension- continue metoprolol, lisinopril.  5.  Diabetes type 2 without complication- continue Glipizide, Actos.    6. BPH - cont. Flomax.   6. Hyperlipidemia - cont. Pravachol.   PT consult for leg pain     Code Status Orders  (From admission, onward)        Start     Ordered   10/04/17 2051  Full code  Continuous     10/04/17 2050    Code Status History    Date Active Date Inactive Code Status Order ID Comments User Context   This patient has a current code status but no historical code status.           Consults  none   DVT Prophylaxis  Lovenox    Lab Results  Component Value Date   PLT 119 (L) 10/07/2017     Time Spent in minutes   Greater than 50% of time spent in care coordination and counseling patient regarding the condition and plan of care.   Ramonita Lab M.D on 10/08/2017 at 12:27 PM  Between 7am to 6pm - Pager - [phone]  After 6pm go to [email or web address] - password [email or web address]  [facility]   Office  [phone]

## 2017-10-08 NOTE — Plan of Care (Signed)
Has 2+ small amount pitting edema on top of right foot. Pain controlled with norco. Ambulated in hall with PT and tolerated well except for right foot pain. [place] who is recovering from MI. Discussed need to FU/establish care for his right foot at home. Continued Unasyn IV antibiotics. INR 3.22 with no sign/symptom bleeding with coumadin therapy.

## 2017-10-08 NOTE — Evaluation (Signed)
Physical Therapy Evaluation Patient Details Name: Joseph Cuevas MRN: [id number] DOB: [birth year] Today's Date: 10/08/2017   History of Present Illness  Pt is an 83 y.o. male presenting to hospital with cellulitis R foot (pt with recent ED visit with similar symptom's and diagnosed with cellulitis; recent x-ray R foot negative for osteomyelitis).  PMH includes DM, a-fib, htn, cardiac surgery, back surgery x3, and chronic SOB.  Clinical Impression  Prior to hospital admission, pt was independent.  Pt lives with wife in 1 level home with 3 STE with R railing.  Currently pt is independent with bed mobility, transfers, ambulating around nursing loop, and modified independent ascending/descending 3 stairs with railing.  Mild decreased forefoot WB'ing noted through R LE but pt donned shoes for session's activities and pt reported significant improvement with ambulation (compared to trying to walk yesterday with hospital socks on).  Pain 4-5/10 R foot at rest beginning of session, with ambulation, and end of session at rest.  No acute PT needs identified.  Will discharge pt from PT in house.  Please re-consult PT if pt's status changes and acute PT needs are identified.    Follow Up Recommendations No PT follow up    Equipment Recommendations  None recommended by PT    Recommendations for Other Services       Precautions / Restrictions Precautions Precautions: None Restrictions Weight Bearing Restrictions: No      Mobility  Bed Mobility Overal bed mobility: Independent             General bed mobility comments: Supine to/from sit without any difficulties.  Transfers Overall transfer level: Independent Equipment used: None             General transfer comment: steady strong stand and sit without any noted difficulties  Ambulation/Gait Ambulation/Gait assistance: Independent Ambulation Distance (Feet): 240 Feet Assistive device: None     Gait velocity interpretation: at  or above normal speed for age/gender General Gait Details: mild decreased stance time R LE (decreased forefoot WB'ing); step through gait pattern; steady; good B heelstrike  Stairs Stairs: Yes Stairs assistance: Modified independent (Device/Increase time) Stair Management: One rail Right Number of Stairs: 3 General stair comments: step to pattern; steady ascending/descending  Wheelchair Mobility    Modified Rankin (Stroke Patients Only)       Balance Overall balance assessment: Independent(No loss of balance with ambulation and head turns R/L/up/down, increasing/decreasing speed, and turning and stopping)                                           Pertinent Vitals/Pain Pain Assessment: 0-10 Pain Score: 5  Pain Location: R lateral distal foot Pain Descriptors / Indicators: Aching Pain Intervention(s): Limited activity within patient's tolerance;Monitored during session;Repositioned  Vitals (HR and O2 on room air) stable and WFL throughout treatment session.    Home Living Family/patient expects to be discharged to:: Private residence Living Arrangements: Spouse/significant other Available Help at Discharge: Family Type of Home: House Home Access: Stairs to enter Entrance Stairs-Rails: Right Entrance Stairs-Number of Steps: 3 Home Layout: One level Home Equipment: Walker - 2 wheels;Cane - single point;Grab bars - toilet      Prior Function Level of Independence: Independent         Comments: Pt denies any falls in past 6 months.     Hand Dominance        Extremity/Trunk  Assessment   Upper Extremity Assessment Upper Extremity Assessment: Overall WFL for tasks assessed    Lower Extremity Assessment Lower Extremity Assessment: Overall WFL for tasks assessed    Cervical / Trunk Assessment Cervical / Trunk Assessment: Normal  Communication   Communication: No difficulties  Cognition Arousal/Alertness: Awake/alert Behavior During Therapy:  WFL for tasks assessed/performed Overall Cognitive Status: Within Functional Limits for tasks assessed                                        General Comments   Nursing cleared pt for participation in physical therapy.  Pt agreeable to PT session.    Exercises     Assessment/Plan    PT Assessment Patent does not need any further PT services  PT Problem List         PT Treatment Interventions      PT Goals (Current goals can be found in the Care Plan section)  Acute Rehab PT Goals Patient Stated Goal: to go home  PT Goal Formulation: With patient Time For Goal Achievement: 10/22/17 Potential to Achieve Goals: Good    Frequency     Barriers to discharge        Co-evaluation               AM-PAC PT "6 Clicks" Daily Activity  Outcome Measure Difficulty turning over in bed (including adjusting bedclothes, sheets and blankets)?: None Difficulty moving from lying on back to sitting on the side of the bed? : None Difficulty sitting down on and standing up from a chair with arms (e.g., wheelchair, bedside commode, etc,.)?: None Help needed moving to and from a bed to chair (including a wheelchair)?: None Help needed walking in hospital room?: None Help needed climbing 3-5 steps with a railing? : None 6 Click Score: 24    End of Session Equipment Utilized During Treatment: Gait belt Activity Tolerance: Patient tolerated treatment well Patient left: in bed;with call bell/phone within reach Nurse Communication: Mobility status;Other (comment)(Pt's pain status) PT Visit Diagnosis: Difficulty in walking, not elsewhere classified (R26.2);Pain Pain - Right/Left: Right Pain - part of body: Ankle and joints of foot    Time: 1335-1350 PT Time Calculation (min) (ACUTE ONLY): 15 min   Charges:   PT Evaluation $PT Eval Low Complexity: 1 Low     PT G Codes:   PT G-Codes **NOT FOR INPATIENT CLASS** Functional Assessment Tool Used: AM-PAC 6 Clicks Basic  Mobility Functional Limitation: Mobility: Walking and moving around Mobility: Walking and Moving Around Current Status (Z6109(G8978): 0 percent impaired, limited or restricted Mobility: Walking and Moving Around Goal Status (U0454(G8979): 0 percent impaired, limited or restricted Mobility: Walking and Moving Around Discharge Status (U9811(G8980): 0 percent impaired, limited or restricted    Hendricks LimesEmily Eliska Hamil, PT 10/08/17, 2:12 PM [phone]

## 2017-10-08 NOTE — Progress Notes (Signed)
ANTICOAGULATION CONSULT NOTE - follow up consult  Pharmacy Consult for warfarin Indication: atrial fibrillation  No Known Allergies  Patient Measurements: Height: 6\' 2"  (188 cm) Weight: 243 lb (110.2 kg) IBW/kg (Calculated) : 82.2  Vital Signs: Temp: 97.6 F (36.4 C) (11/11 0438) Temp Source: Oral (11/11 0438) BP: 167/94 (11/11 0438) Pulse Rate: 71 (11/11 0438)  Labs: Recent Labs    10/06/17 0457 10/07/17 0501 10/07/17 2116 10/08/17 0517  HGB 12.5* 12.5*  --   --   HCT 37.6* 36.4*  --   --   PLT 97* 119*  --   --   LABPROT 27.4* 30.8*  --  32.7*  INR 2.57 2.99  --  3.22  CREATININE 1.14 1.20 1.24  --     Estimated Creatinine Clearance: 60.7 mL/min (by C-G formula based on SCr of 1.24 mg/dL).  Medical History: Past Medical History:  Diagnosis Date  . Atrial fibrillation (HCC)   . Diabetes (HCC)   . Hypertension    Assessment: Pharmacy consult entered to dose and monitor warfarin in this 83 year old male who was taking warfarin PTA for atrial fibrillation. INR = 2.5 was therapeutic on admission  Home regimen: Warfarin 7.5 mg PO daily  Dosing history: Date INR Dose 11/7 2.5 7.5 mg  11/8 - 7.5 mg 11/9 2.5       7.5 mg 11/10   2.99        5 mg 11/11   3.22  Goal of Therapy:  INR 2-3 Monitor platelets by anticoagulation protocol: Yes   Plan:  INR = 3.22, supratherapeutic. Will hold dose of warfarin tonight.   Will recheck INR with AM labs tomorrow and adjust dosing as appropriate.  Stormy CardKatsoudas,Geneva Barrero K, [facility]ampstead HospitalRPH Clinical Pharmacist 10/08/2017,9:50 AM

## 2017-10-08 NOTE — Progress Notes (Signed)
ANTIBIOTIC CONSULT NOTE - INITIAL  Pharmacy Consult for vancomycin and ampicillin/sulbactam Indication: cellulitis  No Known Allergies  Patient Measurements: Height: 6\' 2"  (188 cm) Weight: 243 lb (110.2 kg) IBW/kg (Calculated) : 82.2 Adjusted Body Weight:  93 kg  Vital Signs: Temp: 97.6 F (36.4 C) (11/10 2022) Temp Source: Oral (11/10 2022) BP: 159/76 (11/10 2022) Pulse Rate: 75 (11/10 2022) Intake/Output from previous day: 11/10 0701 - 11/11 0700 In: 1397.4 [P.O.:860; IV Piggyback:537.4] Out: 700 [Urine:700] Intake/Output from this shift: Total I/O In: 137.4 [IV Piggyback:137.4] Out: -   Labs: Recent Labs    10/06/17 0457 10/07/17 0501 10/07/17 2116  WBC 5.2 4.7  --   HGB 12.5* 12.5*  --   PLT 97* 119*  --   CREATININE 1.14 1.20 1.24   Estimated Creatinine Clearance: 60.7 mL/min (by C-G formula based on SCr of 1.24 mg/dL). Recent Labs    10/07/17 2116  [facility]ANC[facility]OUGH 26*     Microbiology: Recent Results (from the past 720 hour(s))  Blood culture (routine x 2)     Status: None   Collection Time: 09/29/17  4:34 PM  Result Value Ref Range Status   Specimen Description   Final    BLOOD Blood Culture results may not be optimal due to an excessive volume of blood received in culture bottles   Special Requests   Final    BOTTLES DRAWN AEROBIC AND ANAEROBIC LEFT ANTECUBITAL   Culture NO GROWTH 5 DAYS  Final   Report Status 10/04/2017 FINAL  Final  Blood culture (routine x 2)     Status: None   Collection Time: 09/29/17  4:34 PM  Result Value Ref Range Status   Specimen Description BLOOD Blood Culture adequate volume  Final   Special Requests   Final    BOTTLES DRAWN AEROBIC AND ANAEROBIC RIGHT ANTECUBITAL   Culture NO GROWTH 5 DAYS  Final   Report Status 10/04/2017 FINAL  Final     Assessment: Pharmacy consulted to dose and monitor vancomycin for right foot cellulitis in this 83 year old male. Patient was seen in ED on 11/2 and discharged on clindamycin but  reports back with worsening cellulitis.   Kinetics: Using adjusted body weight = 93 kg, CrCl 66 mL/min  Ke: 0.061 Half-life: 11.4 hrs Vd: 65 L Cmin (estimated) = ~12 mcg/mL  Goal of Therapy:  Vancomycin trough level 10-15 mcg/ml  Plan Given improvement in renal function, will have to adjust vancomycin dose.  Will change to vancomycin 1000 mg IV q12h  Will check VT prior to 3rd or 4th dose which will be before steady state is reached, but want to ensure not supratherapeutic given changes in dosing.  Continue ampicillin/sulbactam 3 g IV q6h  11/10 2100 VT 26.0 supratherapeutic. Patient's Scr is rising 1.14 >> 1.20 >> 1.24. Will hold off doses of vanc for now and recheck random level w/ am labs. If random level < 20 mcg/mL will redose 15 mg/kg x 1 and dose off random levels.  Thomasene Rippleavid  Jamani Bearce, PharmD, BCPS Clinical Pharmacist 10/08/17 12:52 AM

## 2017-10-09 LAB — VANCOMYCIN, RANDOM: Vancomycin Rm: 16

## 2017-10-09 LAB — PROTIME-INR
INR: 3.01
Prothrombin Time: 31 seconds — ABNORMAL HIGH (ref 11.4–15.2)

## 2017-10-09 MED ORDER — HYDROCHLOROTHIAZIDE 25 MG PO TABS
25.0000 mg | ORAL_TABLET | Freq: Every day | ORAL | Status: DC
  Administered 2017-10-09 – 2017-10-10 (×2): 25 mg via ORAL
  Filled 2017-10-09 (×2): qty 1

## 2017-10-09 MED ORDER — PIPERACILLIN-TAZOBACTAM 3.375 G IVPB
3.3750 g | Freq: Three times a day (TID) | INTRAVENOUS | Status: DC
  Administered 2017-10-09 – 2017-10-10 (×3): 3.375 g via INTRAVENOUS
  Filled 2017-10-09 (×3): qty 50

## 2017-10-09 MED ORDER — WARFARIN SODIUM 5 MG PO TABS
5.0000 mg | ORAL_TABLET | Freq: Once | ORAL | Status: AC
  Administered 2017-10-09: 17:00:00 5 mg via ORAL
  Filled 2017-10-09: qty 1

## 2017-10-09 MED ORDER — VANCOMYCIN HCL IN DEXTROSE 750-5 MG/150ML-% IV SOLN
750.0000 mg | INTRAVENOUS | Status: DC
  Administered 2017-10-09: 22:00:00 750 mg via INTRAVENOUS
  Filled 2017-10-09 (×2): qty 150

## 2017-10-09 NOTE — Plan of Care (Signed)
Pt hoping to be discharged home 10/09/17

## 2017-10-09 NOTE — Progress Notes (Signed)
ANTICOAGULATION CONSULT NOTE - follow up consult  Pharmacy Consult for warfarin Indication: atrial fibrillation  No Known Allergies  Patient Measurements: Height: 6\' 2"  (188 cm) Weight: 243 lb (110.2 kg) IBW/kg (Calculated) : 82.2  Vital Signs: Temp: 97.8 F (36.6 C) (11/12 0509) Temp Source: Oral (11/12 0509) BP: 173/87 (11/12 0509) Pulse Rate: 73 (11/12 0509)  Labs: Recent Labs    10/07/17 0501 10/07/17 2116 10/08/17 0517 10/09/17 0530  HGB 12.5*  --   --   --   HCT 36.4*  --   --   --   PLT 119*  --   --   --   LABPROT 30.8*  --  32.7* 31.0*  INR 2.99  --  3.22 3.01  CREATININE 1.20 1.24  --   --     Estimated Creatinine Clearance: 60.7 mL/min (by C-G formula based on SCr of 1.24 mg/dL).  Medical History: Past Medical History:  Diagnosis Date  . Atrial fibrillation (HCC)   . Diabetes (HCC)   . Hypertension    Assessment: Pharmacy consult entered to dose and monitor warfarin in this 83 year old male who was taking warfarin PTA for atrial fibrillation. INR = 2.5 was therapeutic on admission  Home regimen: Warfarin 7.5 mg PO daily  Dosing history: Date INR Dose 11/7 2.5 7.5 mg  11/8 - 7.5 mg 11/9 2.5       7.5 mg 11/10   2.99        5 mg 11/11   3.2     Held 11/12 3.0     Goal of Therapy:  INR 2-3 Monitor platelets by anticoagulation protocol: Yes   Plan:  INR = 3.0 is therapeutic. Will give warfarin 5 mg PO reduced dose this evening since patient remains on antibiotics.  Will recheck INR with AM labs tomorrow.  Cindi CarbonMary M Islam Villescas, PharmD Clinical Pharmacist 10/09/2017,8:32 AM

## 2017-10-09 NOTE — Progress Notes (Signed)
[employer] [facility] [place] [facility]                                                                                                                                                                                  Patient Demographics   Joseph Cuevas, is a 83 y.o. male, DOB - May 08, 1933, RUE:[id number]  Admit date - 10/04/2017   Admitting Physician Houston Siren, MD  Outpatient Primary MD for the patient is System, Pcp Not In   LOS - 4  Subjective: Patient swelling and redness worse today He complains of chronic shortness of breath, lives in [place]   Review of Systems:   CONSTITUTIONAL: No documented fever. No fatigue, weakness. No weight gain, no weight loss.  EYES: No blurry or double vision.  ENT: No tinnitus. No postnasal drip. No redness of the oropharynx.  RESPIRATORY: No cough, no wheeze, no hemoptysis.  Positive dyspnea.  CARDIOVASCULAR: No chest pain. No orthopnea. No palpitations. No syncope.  GASTROINTESTINAL: No nausea, no vomiting or diarrhea. No abdominal pain. No melena or hematochezia.  GENITOURINARY: No dysuria or hematuria.  ENDOCRINE: No polyuria or nocturia. No heat or cold intolerance.  HEMATOLOGY: No anemia. No bruising. No bleeding.  INTEGUMENTARY: Right foot erythema and redness MUSCULOSKELETAL: No arthritis. No swelling. No gout.  NEUROLOGIC: No numbness, tingling, or ataxia. No seizure-type activity.  PSYCHIATRIC: No anxiety. No insomnia. No ADD.    Vitals:   Vitals:   10/08/17 1310 10/08/17 2030 10/09/17 0509 10/09/17 1149  BP: 137/62 136/73 (!) 173/87 (!) 153/77  Pulse: (!) 43 71 73 60  Resp:  20 20 20   Temp: 97.8 F (36.6 C) 97.7 F (36.5 C) 97.8 F (36.6 C) 97.7 F (36.5 C)  TempSrc: Oral Oral Oral Oral  SpO2: 100% 95% 94% 98%  Weight:      Height:        Wt Readings from Last 3 Encounters:  10/04/17 110.2 kg (243 lb)  09/29/17 95.3 kg (210 lb)  09/29/17 97.5 kg (215 lb)     Intake/Output Summary (Last 24  hours) at 10/09/2017 1258 Last data filed at 10/09/2017 1478 Gross per 24 hour  Intake 839.45 ml  Output 100 ml  Net 739.45 ml    Physical Exam:   GENERAL: Pleasant-appearing in no apparent distress.  HEAD, EYES, EARS, NOSE AND THROAT: Atraumatic, normocephalic. Extraocular muscles are intact. Pupils equal and reactive to light. Sclerae anicteric. No conjunctival injection. No oro-pharyngeal erythema.  NECK: Supple. There is no jugular venous distention. No bruits, no lymphadenopathy, no thyromegaly.  HEART: Regular rate and rhythm,. No murmurs, no rubs, no clicks.  LUNGS: Clear to auscultation bilaterally. No rales or rhonchi.  No wheezes.  ABDOMEN: Soft, flat, nontender, nondistended. Has good bowel sounds. No hepatosplenomegaly appreciated.  EXTREMITIES: Bilateral lower extremity swelling left greater than right, right foot worsening erythema left foot there is an area of erythema-erythema improved NEUROLOGIC: The patient is alert, awake, and oriented x3 with no focal motor or sensory deficits appreciated bilaterally.  SKIN: Moist and warm with no rashes appreciated.  Psych: Not anxious, depressed LN: No inguinal LN enlargement    Antibiotics   Anti-infectives (From admission, onward)   Start     Dose/Rate Route Frequency Ordered Stop   10/09/17 1400  piperacillin-tazobactam (ZOSYN) IVPB 3.375 g     3.375 g 12.5 mL/hr over 240 Minutes Intravenous Every 8 hours 10/09/17 1243     10/08/17 2000  vancomycin (VANCOCIN) IVPB 1000 mg/200 mL premix     1,000 mg 200 mL/hr over 60 Minutes Intravenous  Once 10/08/17 1920 10/08/17 2115   10/06/17 1000  vancomycin (VANCOCIN) IVPB 1000 mg/200 mL premix  Status:  Discontinued     1,000 mg 200 mL/hr over 60 Minutes Intravenous Every 12 hours 10/06/17 0907 10/07/17 2201   10/05/17 2200  vancomycin (VANCOCIN) 1,250 mg in sodium chloride 0.9 % 250 mL IVPB  Status:  Discontinued     1,250 mg 166.7 mL/hr over 90 Minutes Intravenous Every 24 hours  10/05/17 0916 10/06/17 0906   10/05/17 1230  Ampicillin-Sulbactam (UNASYN) 3 g in sodium chloride 0.9 % 100 mL IVPB  Status:  Discontinued     3 g 200 mL/hr over 30 Minutes Intravenous Every 6 hours 10/05/17 1110 10/09/17 1242   10/04/17 2200  vancomycin (VANCOCIN) 1,250 mg in sodium chloride 0.9 % 250 mL IVPB  Status:  Discontinued     1,250 mg 166.7 mL/hr over 90 Minutes Intravenous Every 8 hours 10/04/17 2109 10/04/17 2114   10/04/17 2200  vancomycin (VANCOCIN) IVPB 1000 mg/200 mL premix  Status:  Discontinued     1,000 mg 200 mL/hr over 60 Minutes Intravenous Every 8 hours 10/04/17 2114 10/04/17 2123   10/04/17 2200  vancomycin (VANCOCIN) IVPB 1000 mg/200 mL premix  Status:  Discontinued     1,000 mg 200 mL/hr over 60 Minutes Intravenous Every 12 hours 10/04/17 2123 10/05/17 0924   10/04/17 1430  piperacillin-tazobactam (ZOSYN) IVPB 3.375 g     3.375 g 100 mL/hr over 30 Minutes Intravenous  Once 10/04/17 1426 10/04/17 2312   10/04/17 1430  vancomycin (VANCOCIN) IVPB 1000 mg/200 mL premix     1,000 mg 200 mL/hr over 60 Minutes Intravenous  Once 10/04/17 1426 10/04/17 2313      Medications   Scheduled Meds: . glipiZIDE  10 mg Oral QAC breakfast  . hydrochlorothiazide  25 mg Oral Daily  . lisinopril  20 mg Oral Daily  . metoprolol tartrate  25 mg Oral BID  . pioglitazone  30 mg Oral Daily  . pravastatin  80 mg Oral Daily  . sodium chloride flush  3 mL Intravenous Q12H  . tamsulosin  0.4 mg Oral Daily  . warfarin  5 mg Oral ONCE-1800  . Warfarin - Pharmacist Dosing Inpatient   Does not apply q1800   Continuous Infusions: . piperacillin-tazobactam (ZOSYN)  IV     PRN Meds:.acetaminophen **OR** acetaminophen, furosemide, HYDROcodone-acetaminophen, ondansetron **OR** ondansetron (ZOFRAN) IV, sodium chloride flush, zolpidem   Data Review:   Micro Results Recent Results (from the past 240 hour(s))  Blood culture (routine x 2)     Status: None   Collection Time: 09/29/17  4:34 PM  Result Value Ref Range Status   Specimen Description   Final    BLOOD Blood Culture results may not be optimal due to an excessive volume of blood received in culture bottles   Special Requests   Final    BOTTLES DRAWN AEROBIC AND ANAEROBIC LEFT ANTECUBITAL   Culture NO GROWTH 5 DAYS  Final   Report Status 10/04/2017 FINAL  Final  Blood culture (routine x 2)     Status: None   Collection Time: 09/29/17  4:34 PM  Result Value Ref Range Status   Specimen Description BLOOD Blood Culture adequate volume  Final   Special Requests   Final    BOTTLES DRAWN AEROBIC AND ANAEROBIC RIGHT ANTECUBITAL   Culture NO GROWTH 5 DAYS  Final   Report Status 10/04/2017 FINAL  Final    Radiology Reports US Venous Img Lower Bilateral  Result Date: 10/05/2017 CLINICAL DATA:  83 year old male with bilateral lower extremity swelling EXAM: BILATERAL LOWER EXTREMITY VENOUS DOPPLER ULTRASOUND TECHNIQUE: Gray-scale sonography with graded compression, as well as color Doppler and duplex ultrasound were performed to evaluate the lower extremity deep venous systems from the level of the common femoral vein and including the common femoral, femoral, profunda femoral, popliteal and calf veins including the posterior tibial, peroneal and gastrocnemius veins when visible. The superficial great saphenous vein was also interrogated. Spectral Doppler was utilized to evaluate flow at rest and with distal augmentation maneuvers in the common femoral, femoral and popliteal veins. COMPARISON:  None. FINDINGS: RIGHT LOWER EXTREMITY Common Femoral Vein: No evidence of thrombus. Normal compressibility, respiratory phasicity and response to augmentation. Increased pulsatility of the venous waveform. Saphenofemoral Junction: No evidence of thrombus. Normal compressibility and flow on color Doppler imaging. Profunda Femoral Vein: No evidence of thrombus. Normal compressibility and flow on color Doppler imaging. Femoral Vein: No  evidence of thrombus. Normal compressibility, respiratory phasicity and response to augmentation. Popliteal Vein: No evidence of thrombus. Normal compressibility, respiratory phasicity and response to augmentation. Calf Veins: No evidence of thrombus. Normal compressibility and flow on color Doppler imaging. Superficial Great Saphenous Vein: No evidence of thrombus. Normal compressibility. Venous Reflux:  None. Other Findings:  None. LEFT LOWER EXTREMITY Common Femoral Vein: No evidence of thrombus. Normal compressibility, respiratory phasicity and response to augmentation. Increased pulsatility of the venous waveform. Saphenofemoral Junction: No evidence of thrombus. Normal compressibility and flow on color Doppler imaging. Profunda Femoral Vein: No evidence of thrombus. Normal compressibility and flow on color Doppler imaging. Femoral Vein: No evidence of thrombus. Normal compressibility, respiratory phasicity and response to augmentation. Popliteal Vein: No evidence of thrombus. Normal compressibility, respiratory phasicity and response to augmentation. Calf Veins: No evidence of thrombus. Normal compressibility and flow on color Doppler imaging. Superficial Great Saphenous Vein: No evidence of thrombus. Normal compressibility. Venous Reflux:  None. Other Findings:  None. IMPRESSION: 1. No evidence of deep venous thrombosis in either lower extremity. 2. Increased pulsatility of the venous waveforms bilaterally as can be seen in the setting of elevated right heart pressures. Common considerations include tricuspid regurgitation, right heart failure, pulmonary hypertension and COPD. Electronically Signed   By: Malachy Moan M.D.   On: 10/05/2017 13:41   Dg Foot Complete Right  Result Date: 09/29/2017 CLINICAL DATA:  Right fifth toe pain. EXAM: RIGHT FOOT COMPLETE - 3+ VIEW COMPARISON:  None. FINDINGS: There is no evidence of acute fracture or dislocation. There is no evidence of arthropathy. Mild posterior  calcaneal spurring is noted. No lytic destruction is  seen to suggest osteomyelitis. Vascular calcifications are noted. Dorsal soft tissue swelling is noted suggesting inflammation. IMPRESSION: Dorsal soft tissue swelling is noted suggesting inflammation. No lytic destruction is seen to suggest osteomyelitis. Electronically Signed   By: Lupita RaiderJames  Green Jr, M.D.   On: 09/29/2017 15:54     CBC Recent Labs  Lab 10/04/17 1244 10/06/17 0457 10/07/17 0501  WBC 6.7 5.2 4.7  HGB 13.1 12.5* 12.5*  HCT 39.0* 37.6* 36.4*  PLT 145* 97* 119*  MCV 93.4 93.0 92.4  MCH 31.3 30.9 31.8  MCHC 33.5 33.3 34.4  RDW 14.4 14.3 14.0    Chemistries  Recent Labs  Lab 10/04/17 1244 10/06/17 0457 10/07/17 0501 10/07/17 2116  NA 140  --  140 138  K 4.3  --  3.6 3.5  CL 106  --  106 105  CO2 25  --  26 25  GLUCOSE 156*  --  123* 134*  BUN 34*  --  19 17  CREATININE 1.64* 1.14 1.20 1.24  CALCIUM 8.7*  --  8.3* 8.2*  AST 32  --   --   --   ALT 31  --   --   --   ALKPHOS 111  --   --   --   BILITOT 1.3*  --   --   --    ------------------------------------------------------------------------------------------------------------------ estimated creatinine clearance is 60.7 mL/min (by C-G formula based on SCr of 1.24 mg/dL). ------------------------------------------------------------------------------------------------------------------ No results for input(s): HGBA1C in the last 72 hours. ------------------------------------------------------------------------------------------------------------------ No results for input(s): CHOL, HDL, LDLCALC, TRIG, CHOLHDL, LDLDIRECT in the last 72 hours. ------------------------------------------------------------------------------------------------------------------ No results for input(s): TSH, T4TOTAL, T3FREE, THYROIDAB in the last 72 hours.  Invalid input(s):  FREET3 ------------------------------------------------------------------------------------------------------------------ No results for input(s): VITAMINB12, FOLATE, FERRITIN, TIBC, IRON, RETICCTPCT in the last 72 hours.  Coagulation profile Recent Labs  Lab 10/04/17 2117 10/06/17 0457 10/07/17 0501 10/08/17 0517 10/09/17 0530  INR 2.51 2.57 2.99 3.22 3.01    No results for input(s): DDIMER in the last 72 hours.  Cardiac Enzymes No results for input(s): CKMB, TROPONINI, MYOGLOBIN in the last 168 hours.  Invalid input(s): CK ------------------------------------------------------------------------------------------------------------------ Invalid input(s): POCBNP    Assessment & Plan   83 year old male with past medical history of diabetes, hypertension, atrial fibrillation who presents to the hospital due to right foot pain.  1.  Right foot cellulitis No significant improvement with IV Unasyn and vancomycin, will change antibiotic to Zosyn and vanc Patient was evaluated by vascular surgery who has recommended outpatient follow-up with vascular surgery and podiatry [place] If no clinical improvement will consider ID consult  2. Dyspnea echo of heart 50-55% ejection fraction.  Systolic function was normal mild mitral valve regurgitation and trivial  aortic regurgitation  3. History of atrial fibrillation-rate controlled.  Continue metoprolol.  Continue Coumadin. 3.01 iNR  4.  Essential hypertension- continue metoprolol, lisinopril.  Hydrochlorothiazide resumed  5.  Diabetes type 2 without complication- continue Glipizide, Actos.    6. BPH - cont. Flomax.   6. Hyperlipidemia - cont. Pravachol.   PT evaluation done no PT needs identified     Code Status Orders  (From admission, onward)        Start     Ordered   10/04/17 2051  Full code  Continuous     10/04/17 2050    Code Status History    Date Active Date Inactive  Code Status Order ID Comments User Context  This patient has a current code status but no historical code status.           Consults  none   DVT Prophylaxis  Lovenox    Lab Results  Component Value Date   PLT 119 (L) 10/07/2017     Time Spent in minutes   Greater than 50% of time spent in care coordination and counseling patient regarding the condition and plan of care.   Ramonita Lab M.D on 10/09/2017 at 12:58 PM  Between 7am to 6pm - Pager - [phone]  After 6pm go to [email or web address] - password [email or web address] [facility]  [facility] [place] [facility]   Office  [phone]

## 2017-10-09 NOTE — Care Management Important Message (Signed)
Important Message  Patient Details  Name: Joseph Cuevas MRN: [id number] Date of Birth: 1933/07/25   Medicare Important Message Given:  Yes    Gwenette GreetBrenda S Destan Franchini, RN 10/09/2017, 8:26 AM

## 2017-10-09 NOTE — Progress Notes (Addendum)
ANTIBIOTIC CONSULT NOTE - INITIAL  Pharmacy Consult for vancomycin and ampicillin/sulbactam Indication: cellulitis  No Known Allergies  Patient Measurements: Height: 6\' 2"  (188 cm) Weight: 243 lb (110.2 kg) IBW/kg (Calculated) : 82.2 Adjusted Body Weight:  93 kg  Vital Signs: Temp: 97.7 F (36.5 C) (11/12 1149) Temp Source: Oral (11/12 1149) BP: 153/77 (11/12 1149) Pulse Rate: 60 (11/12 1149) Intake/Output from previous day: 11/11 0701 - 11/12 0700 In: 1059.5 [P.O.:480; IV Piggyback:579.5] Out: 250 [Urine:250] Intake/Output from this shift: Total I/O In: 360 [P.O.:360] Out: -   Labs: Recent Labs    10/07/17 0501 10/07/17 2116  WBC 4.7  --   HGB 12.5*  --   PLT 119*  --   CREATININE 1.20 1.24   Estimated Creatinine Clearance: 60.7 mL/min (by C-G formula based on SCr of 1.24 mg/dL). Recent Labs    10/07/17 2116  10/08/17 1715 10/09/17 1647  VANCOTROUGH 26*  --   --   --   VANCORANDOM  --    < > 17 16   < > = values in this interval not displayed.     Microbiology: Recent Results (from the past 720 hour(s))  Blood culture (routine x 2)     Status: None   Collection Time: 09/29/17  4:34 PM  Result Value Ref Range Status   Specimen Description   Final    BLOOD Blood Culture results may not be optimal due to an excessive volume of blood received in culture bottles   Special Requests   Final    BOTTLES DRAWN AEROBIC AND ANAEROBIC LEFT ANTECUBITAL   Culture NO GROWTH 5 DAYS  Final   Report Status 10/04/2017 FINAL  Final  Blood culture (routine x 2)     Status: None   Collection Time: 09/29/17  4:34 PM  Result Value Ref Range Status   Specimen Description BLOOD Blood Culture adequate volume  Final   Special Requests   Final    BOTTLES DRAWN AEROBIC AND ANAEROBIC RIGHT ANTECUBITAL   Culture NO GROWTH 5 DAYS  Final   Report Status 10/04/2017 FINAL  Final     Assessment: Pharmacy consulted to dose and monitor vancomycin for right foot cellulitis in this 83  year old male. Patient was seen in ED on 11/2 and discharged on clindamycin but reports back with worsening cellulitis.   Kinetics: Using adjusted body weight = 93 kg, CrCl 66 mL/min  Ke: 0.061 Half-life: 11.4 hrs Vd: 65 L Cmin (estimated) = ~12 mcg/mL  Goal of Therapy:  Vancomycin trough level 10-15 mcg/ml  Plan Given improvement in renal function, will have to adjust vancomycin dose.  Will change to vancomycin 1000 mg IV q12h  Will check VT prior to 3rd or 4th dose which will be before steady state is reached, but want to ensure not supratherapeutic given changes in dosing.  Continue ampicillin/sulbactam 3 g IV q6h  11/10 2100 VT 26.0 supratherapeutic. Patient's Scr is rising 1.14 >> 1.20 >> 1.24. Will hold off doses of vanc for now and recheck random level w/ am labs. If random level < 20 mcg/mL will redose 15 mg/kg x 1 and dose off random levels.  11/11 @ 0500 VR 21 slightly therapeutic. Ke based on two levels: 0.0267 Patient specific t1/2: 26 hrs Will hold further doses of vanc and draw another random @ 1700 (which is when level should be down to 15). If level is < 20 mcg/mL will redose w/ 15 mg/kg IV x 1.  11/11 :  Random Vanc @ 17:15 = 17 mcg/mL .    Vanc level has only fallen by 4 in past 12 hrs.  Will give additional vancomycin 1 gm IV X 1 and recheck vanc level on 11/12 @ 17:00.   11/11: Random vanc 3016mcg/mL. Will go ahead and do vancomycin 750mg  iv  q24h at this time, will redraw trough level before 4th dose. Continue to monitor renal fx change.   Joseph PullingGarrett Joseph Cuevas, PharmD Clinical Pharmacist 10/09/17 6:19 PM

## 2017-10-09 NOTE — Plan of Care (Signed)
  Progressing Education: Knowledge of General Education information will improve 10/09/2017 0152 - Progressing by Garwin Brothershomas, Danaisha Celli Lynn, RN 10/09/2017 [phone] - Progressing by Garwin Brothershomas, Yee Joss Lynn, RN Health Behavior/Discharge Planning: Ability to manage health-related needs will improve 10/09/2017 0152 - Progressing by Garwin Brothershomas, Bryant Saye Lynn, RN 10/09/2017 [phone] - Progressing by Garwin Brothershomas, Kaydence Baba Lynn, RN Clinical Measurements: Ability to maintain clinical measurements within normal limits will improve 10/09/2017 0152 - Progressing by Garwin Brothershomas, Petar Mucci Lynn, RN 10/09/2017 [phone] - Progressing by Garwin Brothershomas, Leona Pressly Lynn, RN Will remain free from infection 10/09/2017 0152 - Progressing by Garwin Brothershomas, Yehudah Standing Lynn, RN 10/09/2017 [phone] - Progressing by Garwin Brothershomas, Mickal Meno Lynn, RN Diagnostic test results will improve 10/09/2017 0152 - Progressing by Garwin Brothershomas, Island Dohmen Lynn, RN 10/09/2017 [phone] - Progressing by Garwin Brothershomas, Murriel Holwerda Lynn, RN Activity: Risk for activity intolerance will decrease 10/09/2017 0152 - Progressing by Garwin Brothershomas, Ishanvi Mcquitty Lynn, RN 10/09/2017 0146 - Progressing by Garwin Brothershomas, Pheobe Sandiford Lynn, RN Elimination: Will not experience complications related to urinary retention 10/09/2017 0152 - Progressing by Garwin Brothershomas, Rewa Weissberg Lynn, RN 10/09/2017 0146 - Progressing by Garwin Brothershomas, Len Kluver Lynn, RN Pain Managment: General experience of comfort will improve 10/09/2017 0152 - Progressing by Garwin Brothershomas, Nil Xiong Lynn, RN 10/09/2017 0146 - Progressing by Garwin Brothershomas, Demareon Coldwell Lynn, RN Skin Integrity: Risk for impaired skin integrity will decrease 10/09/2017 0152 - Progressing by Garwin Brothershomas, Deborha Moseley Lynn, RN 10/09/2017 0146 - Progressing by Garwin Brothershomas, Jamica Woodyard Lynn, RN Clinical Measurements: Ability to avoid or minimize complications of infection will improve 10/09/2017 0152 - Progressing by Garwin Brothershomas, Izak Anding Lynn, RN 10/09/2017 0146 - Progressing by Garwin Brothershomas, Etan Vasudevan Lynn, RN Skin Integrity: Skin integrity will improve 10/09/2017 0152 - Progressing by Garwin Brothershomas, Edric Fetterman Lynn,  RN 10/09/2017 [phone] - Progressing by Garwin Brothershomas, Jaeleah Smyser Lynn, RN

## 2017-10-10 LAB — CBC
HEMATOCRIT: 36.6 % — AB (ref 40.0–52.0)
HEMOGLOBIN: 12.2 g/dL — AB (ref 13.0–18.0)
MCH: 31.1 pg (ref 26.0–34.0)
MCHC: 33.3 g/dL (ref 32.0–36.0)
MCV: 93.3 fL (ref 80.0–100.0)
PLATELETS: 122 10*3/uL — AB (ref 150–440)
RBC: 3.92 MIL/uL — AB (ref 4.40–5.90)
RDW: 14.4 % (ref 11.5–14.5)
WBC: 4.9 10*3/uL (ref 3.8–10.6)

## 2017-10-10 LAB — CREATININE, SERUM
Creatinine, Ser: 1.5 mg/dL — ABNORMAL HIGH (ref 0.61–1.24)
GFR, EST AFRICAN AMERICAN: 48 mL/min — AB (ref >60)
GFR, EST NON AFRICAN AMERICAN: 42 mL/min — AB (ref >60)

## 2017-10-10 LAB — PROTIME-INR
INR: 2.48
Prothrombin Time: 26.6 seconds — ABNORMAL HIGH (ref 11.4–15.2)

## 2017-10-10 MED ORDER — WARFARIN SODIUM 5 MG PO TABS
5.0000 mg | ORAL_TABLET | Freq: Once | ORAL | Status: DC
  Filled 2017-10-10: qty 1

## 2017-10-10 MED ORDER — FENTANYL CITRATE (PF) 100 MCG/2ML IJ SOLN
INTRAMUSCULAR | Status: AC
  Filled 2017-10-10: qty 2

## 2017-10-10 NOTE — Progress Notes (Signed)
[]  Hide copied text  [] Hover for details   Received MD order to discharge patient to home reviewed home meds, discharge instructions and follow up appointments with patient and patient verbalized understanding

## 2017-10-10 NOTE — Discharge Summary (Signed)
[facility] [place] [facility]   PATIENT NAME: Joseph Cuevas    MR#:  [id number]  DATE OF BIRTH:  1933/04/23  DATE OF ADMISSION:  10/04/2017 ADMITTING PHYSICIAN: Houston SirenVivek J Sainani, MD  DATE OF DISCHARGE: 10/10/2017  PRIMARY CARE PHYSICIAN: System, Pcp Not In    ADMISSION DIAGNOSIS:  Cellulitis of right lower extremity [L03.115]  DISCHARGE DIAGNOSIS:  Active Problems:   Cellulitis   SECONDARY DIAGNOSIS:   Past Medical History:  Diagnosis Date  . Atrial fibrillation (HCC)   . Diabetes (HCC)   . Hypertension     HOSPITAL COURSE:   83 year old male with history of diabetes, paroxysmal atrial fibrillation and essential hypertension who presents with right foot cellulitis  1. right foot cellulitis: Patient was on Unasyn and vancomycin and then changed to Zosyn due to concern of extent of erythema. The redness has improved. The cellulitis extends to the mid foot. He was a value bibasilar surgery while in the hospital. Currently there are no recommendations for inpatient procedures however the vascular surgeon is recommending that the patient follow up with the podiatrist and after surgery back in [place]. He does have chronic foot wounds that he sees his podiatrist for.  2. Dyspnea: His echocardiogram showed normal ejection fraction without major valvular abnormalities. Dyspnea has resolved.  3. Paroxysmal atrial fibrillation: Patient will continue on metoprolol and Coumadin He will need an INR checked in 1 day. Discharge INR is 2.4  4. Diabetes: Patient will continue outpatient medications with ADA diet  5. Essential hypertension: Patient will continue lisinopril says HCTZ and metoprolol  6. BPH: Continue Flomax  DISCHARGE CONDITIONS AND DIET:  Stable for discharge on diabetic diet   CONSULTS OBTAINED:  Treatment Team:  Bertram DenverEsco, Miechia A, MD  DRUG ALLERGIES:  No Known Allergies  DISCHARGE MEDICATIONS:   Current Discharge Medication List     CONTINUE these medications which have NOT CHANGED   Details  clindamycin (CLEOCIN) 300 MG capsule Take 1 capsule (300 mg total) by mouth 3 (three) times daily. Qty: 30 capsule, Refills: 0    glipiZIDE (GLUCOTROL) 10 MG tablet Take 10 mg by mouth daily before breakfast.    lisinopril-hydrochlorothiazide (PRINZIDE,ZESTORETIC) 20-25 MG tablet Take 1 tablet daily by mouth.    metoprolol tartrate (LOPRESSOR) 25 MG tablet Take 25 mg by mouth 2 (two) times daily.    pioglitazone (ACTOS) 30 MG tablet Take 30 mg by mouth daily.    pravastatin (PRAVACHOL) 80 MG tablet Take 80 mg daily by mouth.    tamsulosin (FLOMAX) 0.4 MG CAPS capsule Take 0.4 mg daily by mouth.    warfarin (COUMADIN) 7.5 MG tablet Take 7.5 mg daily by mouth.     furosemide (LASIX) 20 MG tablet Take 20 mg daily as needed by mouth.    HYDROcodone-acetaminophen (NORCO/VICODIN) 5-325 MG tablet 1-2 tabs po q 8 hours prn Qty: 12 tablet, Refills: 0      STOP taking these medications     lisinopril (PRINIVIL,ZESTRIL) 20 MG tablet      oxyCODONE-acetaminophen (PERCOCET/ROXICET) 5-325 MG tablet           Today   CHIEF COMPLAINT:   I want to go home   VITAL SIGNS:  Blood pressure (!) 187/84, pulse 87, temperature 97.6 F (36.4 C), temperature source Oral, resp. rate 18, height 6\' 2"  (1.88 m), weight 110.2 kg (243 lb), SpO2 100 %.   REVIEW OF SYSTEMS:  Review of Systems  Constitutional: Negative.  Negative for chills, fever and malaise/fatigue.  HENT: Negative.  Negative for ear discharge, ear pain, hearing loss, nosebleeds and sore throat.   Eyes: Negative.  Negative for blurred vision and pain.  Respiratory: Negative.  Negative for cough, hemoptysis, shortness of breath and wheezing.   Cardiovascular: Negative.  Negative for chest pain, palpitations and leg swelling.  Gastrointestinal: Negative.  Negative for abdominal pain, blood in stool, diarrhea, nausea and vomiting.  Genitourinary: Negative.  Negative  for dysuria.  Musculoskeletal: Negative.  Negative for back pain.  Skin: Negative.        Cellulitis foot  Neurological: Negative for dizziness, tremors, speech change, focal weakness, seizures and headaches.  Endo/Heme/Allergies: Negative.  Does not bruise/bleed easily.  Psychiatric/Behavioral: Negative.  Negative for depression, hallucinations and suicidal ideas.     PHYSICAL EXAMINATION:  GENERAL:  83 y.o.-year-old patient lying in the bed with no acute distress.  NECK:  Supple, no jugular venous distention. No thyroid enlargement, no tenderness.  LUNGS: Normal breath sounds bilaterally, no wheezing, rales,rhonchi  No use of accessory muscles of respiration.  CARDIOVASCULAR: S1, S2 normal. No murmurs, rubs, or gallops.  ABDOMEN: Soft, non-tender, non-distended. Bowel sounds present. No organomegaly or mass.  EXTREMITIES: No pedal edema, cyanosis, or clubbing.  PSYCHIATRIC: The patient is alert and oriented x 3.  SKIN:left foot red not tender redness extends to the mid foot  DATA REVIEW:   CBC Recent Labs  Lab 10/10/17 0431  WBC 4.9  HGB 12.2*  HCT 36.6*  PLT 122*    Chemistries  Recent Labs  Lab 10/04/17 1244  10/07/17 2116 10/10/17 0431  NA 140   < > 138  --   K 4.3   < > 3.5  --   CL 106   < > 105  --   CO2 25   < > 25  --   GLUCOSE 156*   < > 134*  --   BUN 34*   < > 17  --   CREATININE 1.64*   < > 1.24 1.50*  CALCIUM 8.7*   < > 8.2*  --   AST 32  --   --   --   ALT 31  --   --   --   ALKPHOS 111  --   --   --   BILITOT 1.3*  --   --   --    < > = values in this interval not displayed.    Cardiac Enzymes No results for input(s): TROPONINI in the last 168 hours.  Microbiology Results  @MICRORSLT48 @  RADIOLOGY:  No results found.    Current Discharge Medication List    CONTINUE these medications which have NOT CHANGED   Details  clindamycin (CLEOCIN) 300 MG capsule Take 1 capsule (300 mg total) by mouth 3 (three) times daily. Qty: 30 capsule,  Refills: 0    glipiZIDE (GLUCOTROL) 10 MG tablet Take 10 mg by mouth daily before breakfast.    lisinopril-hydrochlorothiazide (PRINZIDE,ZESTORETIC) 20-25 MG tablet Take 1 tablet daily by mouth.    metoprolol tartrate (LOPRESSOR) 25 MG tablet Take 25 mg by mouth 2 (two) times daily.    pioglitazone (ACTOS) 30 MG tablet Take 30 mg by mouth daily.    pravastatin (PRAVACHOL) 80 MG tablet Take 80 mg daily by mouth.    tamsulosin (FLOMAX) 0.4 MG CAPS capsule Take 0.4 mg daily by mouth.    warfarin (COUMADIN) 7.5 MG tablet Take 7.5 mg daily by mouth.     furosemide (LASIX) 20 MG tablet Take 20  mg daily as needed by mouth.    HYDROcodone-acetaminophen (NORCO/VICODIN) 5-325 MG tablet 1-2 tabs po q 8 hours prn Qty: 12 tablet, Refills: 0      STOP taking these medications     lisinopril (PRINIVIL,ZESTRIL) 20 MG tablet      oxyCODONE-acetaminophen (PERCOCET/ROXICET) 5-325 MG tablet            Management plans discussed with the patient and he is in agreement. Stable for discharge   Patient should follow up with podiatry and vascular surgery in [place]  CODE STATUS:     Code Status Orders  (From admission, onward)        Start     Ordered   10/04/17 2051  Full code  Continuous     10/04/17 2050    Code Status History    Date Active Date Inactive Code Status Order ID Comments User Context   This patient has a current code status but no historical code status.      TOTAL TIME TAKING CARE OF THIS PATIENT: 38 minutes.    Note: This dictation was prepared with Dragon dictation along with smaller phrase technology. Any transcriptional errors that result from this process are unintentional.  Naba Sneed M.D on 10/10/2017 at 11:27 AM  Between 7am to 6pm - Pager - [phone] After 6pm go to [email or web address] - password [email or web address]  [facility]  Office  [phone]  CC: Primary care physician; System, Pcp Not In

## 2017-10-10 NOTE — Progress Notes (Signed)
Inpatient Diabetes Program Recommendations  AACE/ADA: New Consensus Statement on Inpatient Glycemic Control (2015)  Target Ranges:  Prepandial:   less than 140 mg/dL      Peak postprandial:   less than 180 mg/dL (1-2 hours)      Critically ill patients:  140 - 180 mg/dL   Results for Joseph Cuevas, Dreux (MRN [id number]) as of 10/10/2017 07:29  Ref. Range 10/04/2017 12:44 10/07/2017 05:01 10/07/2017 21:16  Glucose Latest Ref Range: 65 - 99 mg/dL 409156 (H) 811123 (H) 914134 (H)   Review of Glycemic Control  Diabetes history: DM2 Outpatient Diabetes medications: Glipizide 10 mg QAM, Actos 30 mg daily Current orders for Inpatient glycemic control:  Glipizide 10 mg QAM, Actos 30 mg daily  Inpatient Diabetes Program Recommendations:  Correction (SSI): While inpatient, please consider ordering CBGs with Novolog 0-9 units TID with meals and Novolog 0-5 units QHS for correction when needed.  NOTE: Patient is ordered oral DM medications and NO finger sticks are being checked. Last lab glucose was on 10/07/17. While inpatient, please order CBG monitoring and Novolog correction scale.  Thanks, Orlando PennerMarie Stefano Trulson, RN, MSN, CDE Diabetes Coordinator Inpatient Diabetes Program [phone] (Team Pager from 8am to 5pm)

## 2017-10-10 NOTE — Progress Notes (Signed)
ANTICOAGULATION CONSULT NOTE - follow up consult  Pharmacy Consult for warfarin Indication: atrial fibrillation  No Known Allergies  Patient Measurements: Height: 6\' 2"  (188 cm) Weight: 243 lb (110.2 kg) IBW/kg (Calculated) : 82.2  Vital Signs: Temp: 97.6 F (36.4 C) (11/13 0833) Temp Source: Oral (11/13 0833) BP: 187/84 (11/13 0833) Pulse Rate: 87 (11/13 0833)  Labs: Recent Labs    10/07/17 2116 10/08/17 0517 10/09/17 0530 10/10/17 0431  HGB  --   --   --  12.2*  HCT  --   --   --  36.6*  PLT  --   --   --  122*  LABPROT  --  32.7* 31.0* 26.6*  INR  --  3.22 3.01 2.48  CREATININE 1.24  --   --  1.50*    Estimated Creatinine Clearance: 50.2 mL/min (A) (by C-G formula based on SCr of 1.5 mg/dL (H)).  Medical History: Past Medical History:  Diagnosis Date  . Atrial fibrillation (HCC)   . Diabetes (HCC)   . Hypertension    Assessment: Pharmacy consult entered to dose and monitor warfarin in this 83 year old male who was taking warfarin PTA for atrial fibrillation. INR = 2.5 was therapeutic on admission  Home regimen: Warfarin 7.5 mg PO daily  Dosing history: Date INR Dose 11/7 2.5 7.5 mg  11/8 - 7.5 mg 11/9 2.5       7.5 mg 11/10   2.99        5 mg 11/11   3.2     Held 11/12 3.0    5 mg    11/13 2.5  Goal of Therapy:  INR 2-3 Monitor platelets by anticoagulation protocol: Yes   Plan:  INR = 2.5 is therapeutic. Will continue with reduced dose of warfarin 5 mg PO this evening since patient remains on antibiotics.  Anticipate INR to decrease some tomorrow as a reflection of dose that was held on 11/11.  Will continue to monitor INR daily while on antibiotics.   Cindi CarbonMary M Kelie Gainey, PharmD, BCPS Clinical Pharmacist 10/10/2017,8:56 AM

## 2018-09-28 DEATH — deceased

## 2019-07-26 IMAGING — US US EXTREM LOW VENOUS BILAT
1 series · 13 of 24 positions shown · non-contrast
Comparison: None.

CLINICAL DATA: 84-year-old male with bilateral lower extremity
swelling



[Series 1: us extrem low venous bilat · 0.09mm/px · 13 of 58 slices shown]
[im 1/58]
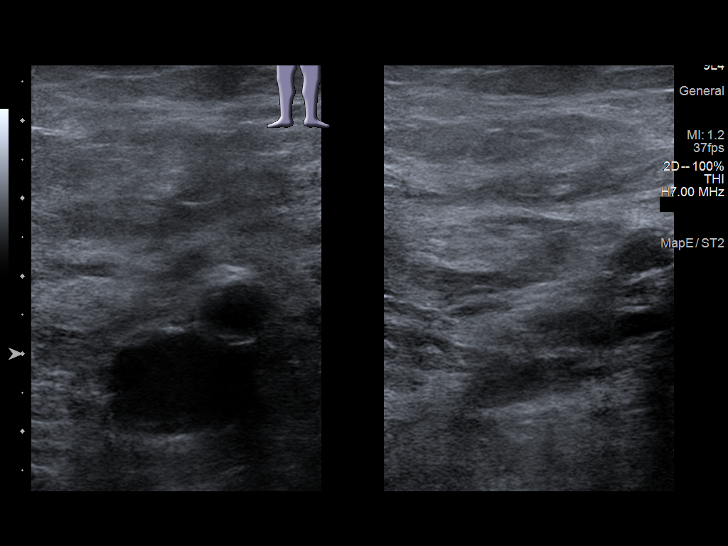
[im 5/58]
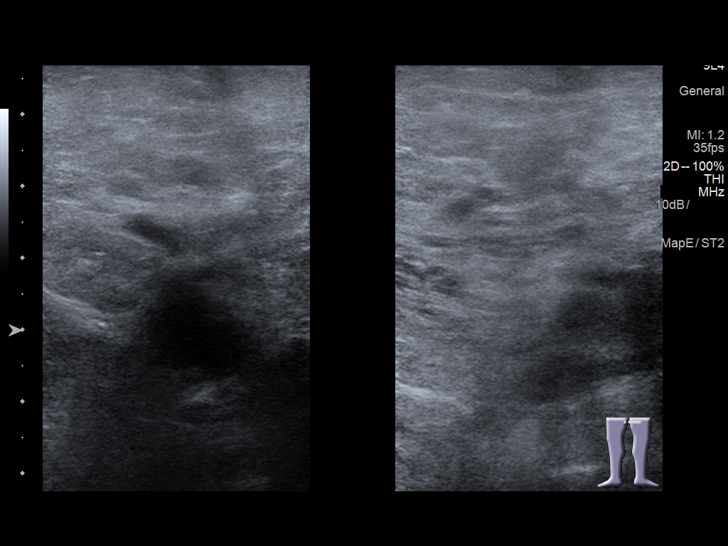
[im 10/58]
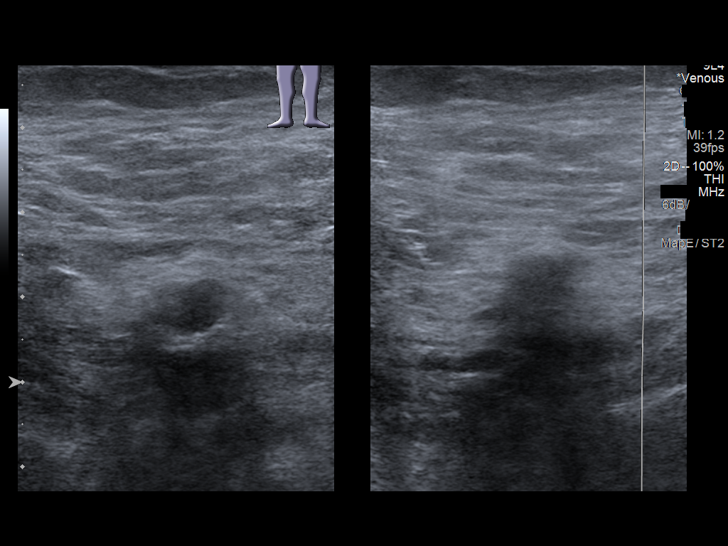
[im 15/58]
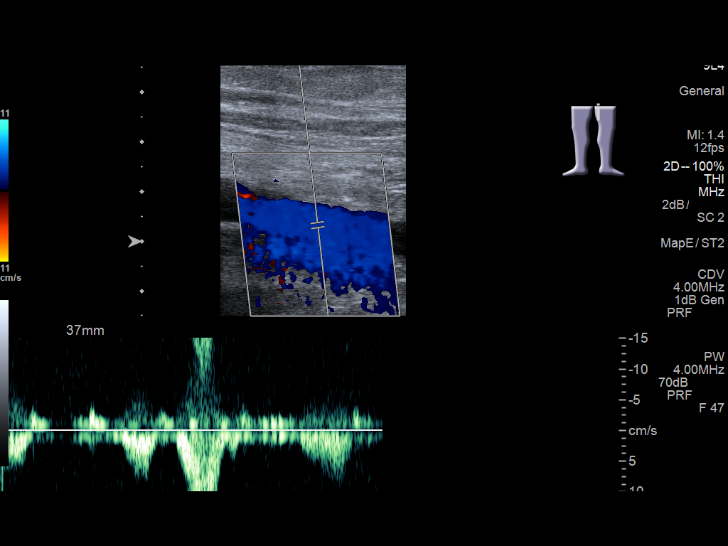
[im 20/58]
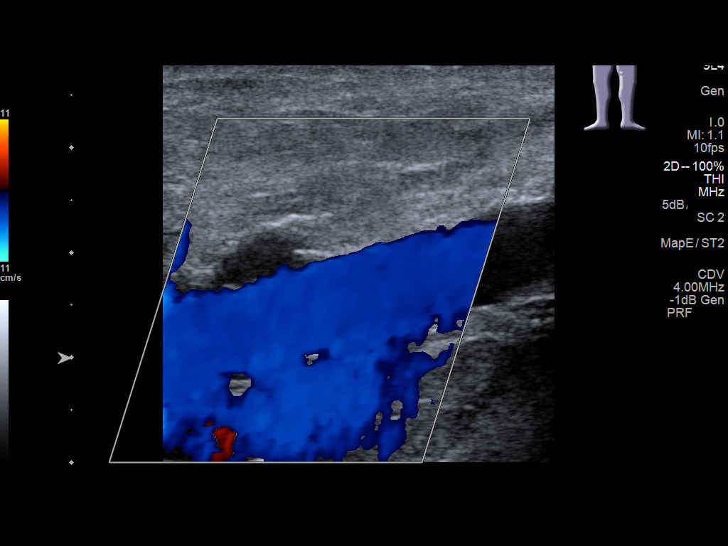
[im 25/58]
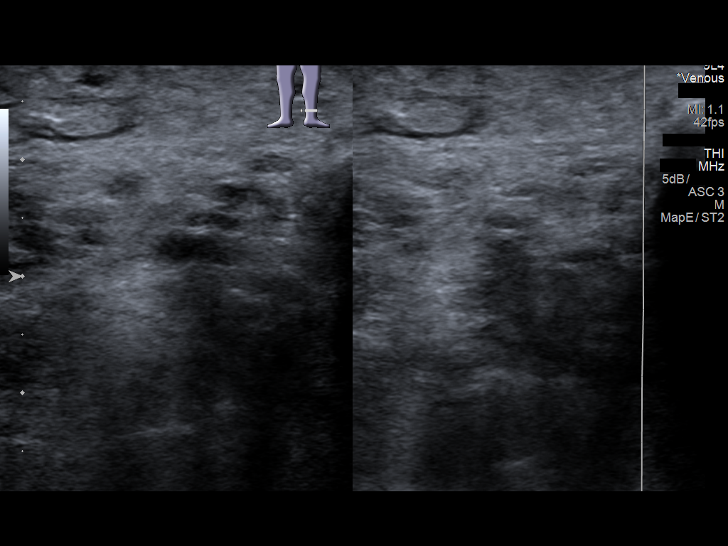
[im 30/58]
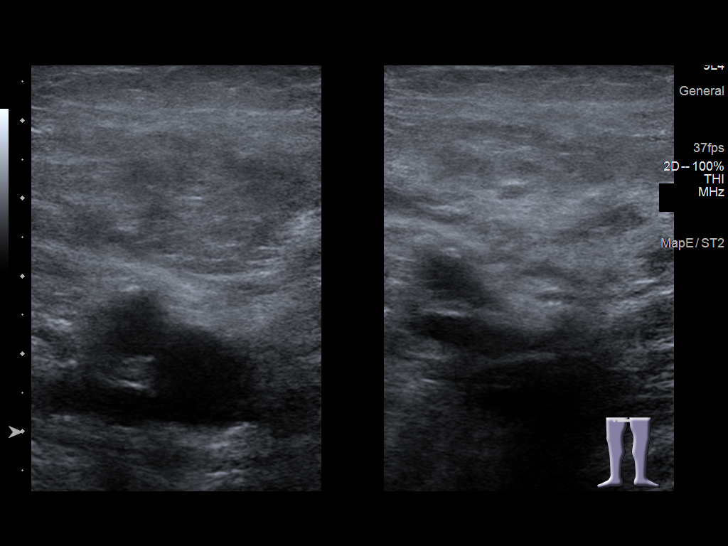
[im 33/58]
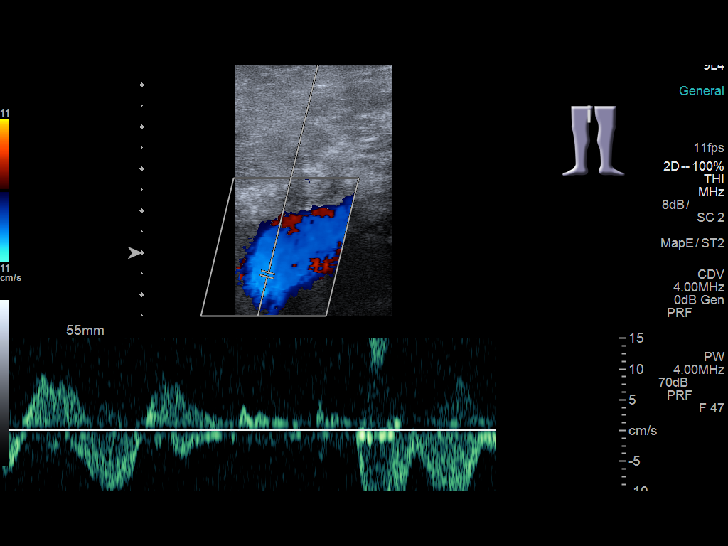
[im 38/58]
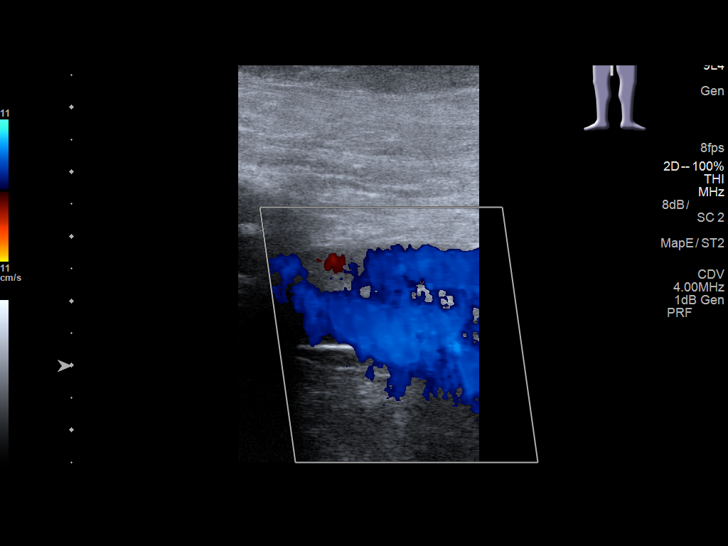
[im 43/58]
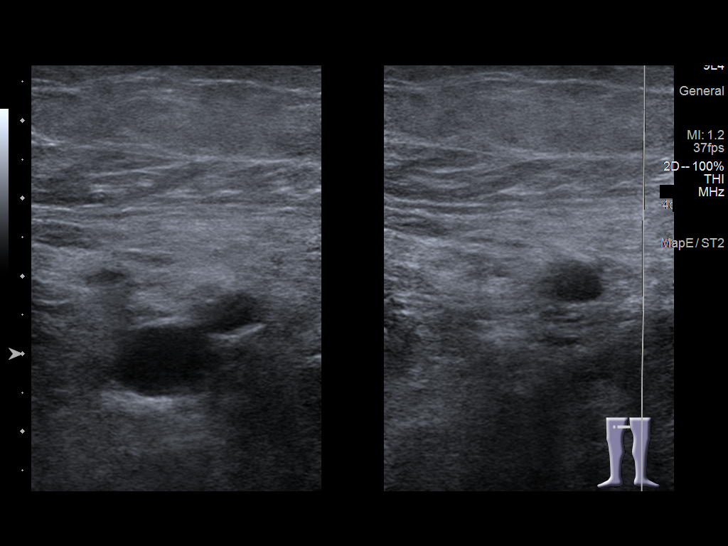
[im 48/58]
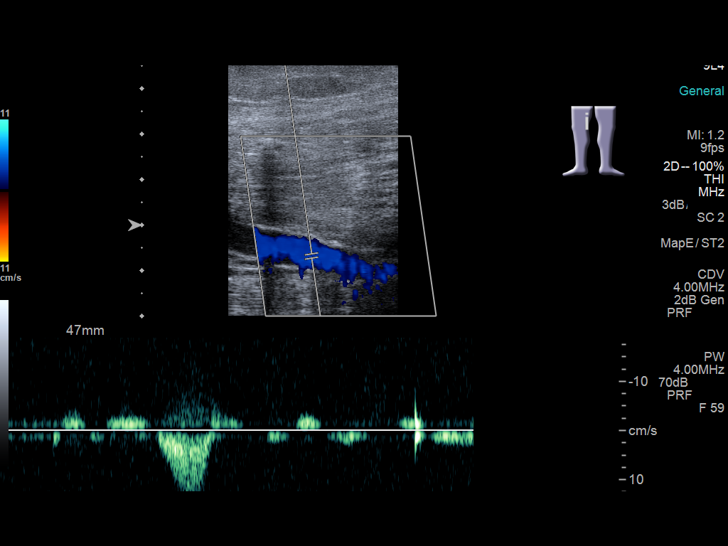
[im 53/58]
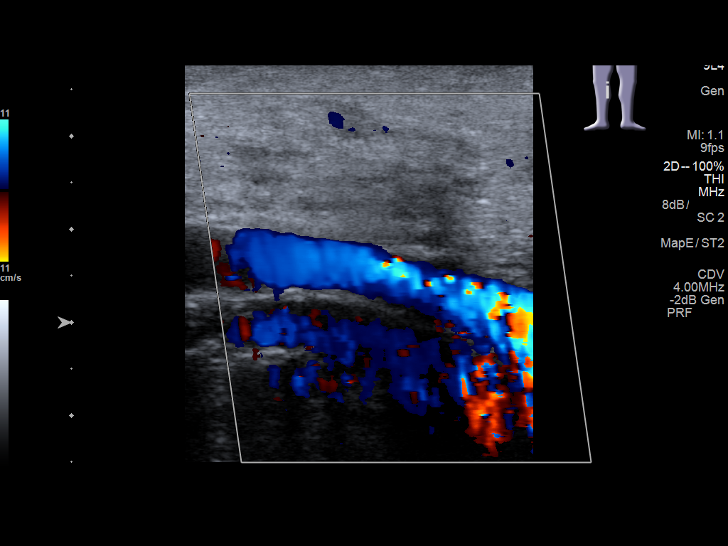
[im 58/58]
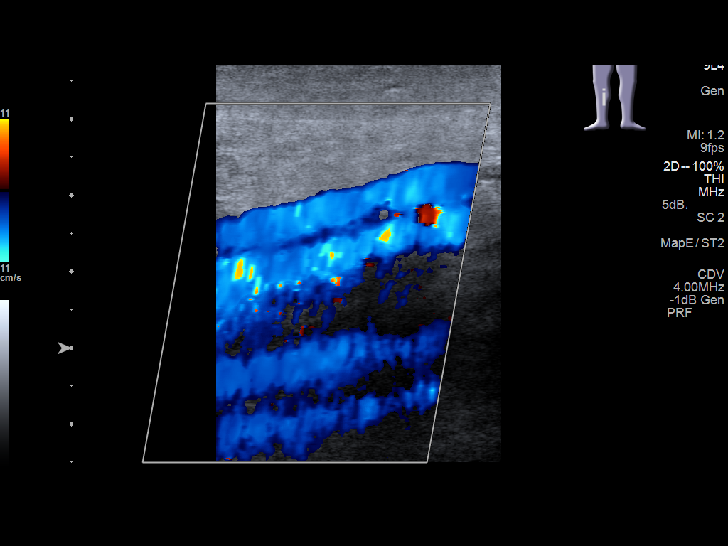

[13 of 24 positions shown; findings below may reference images not displayed]

FINDINGS: RIGHT LOWER EXTREMITY

Common Femoral Vein: No evidence of thrombus. Normal
compressibility, respiratory phasicity and response to augmentation.
Increased pulsatility of the venous waveform.

Saphenofemoral Junction: No evidence of thrombus. Normal
compressibility and flow on color Doppler imaging.

Profunda Femoral Vein: No evidence of thrombus. Normal
compressibility and flow on color Doppler imaging.

Femoral Vein: No evidence of thrombus. Normal compressibility,
respiratory phasicity and response to augmentation.

Popliteal Vein: No evidence of thrombus. Normal compressibility,
respiratory phasicity and response to augmentation.

Calf Veins: No evidence of thrombus. Normal compressibility and flow
on color Doppler imaging.

Superficial Great Saphenous Vein: No evidence of thrombus. Normal
compressibility.

Venous Reflux:  None.

Other Findings:  None.

LEFT LOWER EXTREMITY

Common Femoral Vein: No evidence of thrombus. Normal
compressibility, respiratory phasicity and response to augmentation.
Increased pulsatility of the venous waveform.

Saphenofemoral Junction: No evidence of thrombus. Normal
compressibility and flow on color Doppler imaging.

Profunda Femoral Vein: No evidence of thrombus. Normal
compressibility and flow on color Doppler imaging.

Femoral Vein: No evidence of thrombus. Normal compressibility,
respiratory phasicity and response to augmentation.

Popliteal Vein: No evidence of thrombus. Normal compressibility,
respiratory phasicity and response to augmentation.

Calf Veins: No evidence of thrombus. Normal compressibility and flow
on color Doppler imaging.

Superficial Great Saphenous Vein: No evidence of thrombus. Normal
compressibility.

Venous Reflux:  None.

Other Findings:  None.
IMPRESSION: 1. No evidence of deep venous thrombosis in either lower extremity.
2. Increased pulsatility of the venous waveforms bilaterally as can
be seen in the setting of elevated right heart pressures. Common
considerations include tricuspid regurgitation, right heart failure,
pulmonary hypertension and COPD.
